# Patient Record
Sex: Female | Born: 1985 | Race: White | Hispanic: No | Marital: Married | Smoking: Former smoker
Health system: Southern US, Community
[De-identification: ages and names within clinical notes are randomized; demographics above are authoritative.]

## PROBLEM LIST (undated history)

## (undated) DIAGNOSIS — E282 Polycystic ovarian syndrome: Secondary | ICD-10-CM

## (undated) DIAGNOSIS — K219 Gastro-esophageal reflux disease without esophagitis: Secondary | ICD-10-CM

## (undated) DIAGNOSIS — N3020 Other chronic cystitis without hematuria: Secondary | ICD-10-CM

## (undated) DIAGNOSIS — F32A Depression, unspecified: Secondary | ICD-10-CM

## (undated) DIAGNOSIS — F329 Major depressive disorder, single episode, unspecified: Secondary | ICD-10-CM

## (undated) DIAGNOSIS — O24419 Gestational diabetes mellitus in pregnancy, unspecified control: Secondary | ICD-10-CM

## (undated) DIAGNOSIS — F419 Anxiety disorder, unspecified: Secondary | ICD-10-CM

## (undated) DIAGNOSIS — R030 Elevated blood-pressure reading, without diagnosis of hypertension: Secondary | ICD-10-CM

## (undated) HISTORY — PX: BREAST ENHANCEMENT SURGERY: SHX7

## (undated) HISTORY — DX: Elevated blood-pressure reading, without diagnosis of hypertension: R03.0

## (undated) HISTORY — DX: Other chronic cystitis without hematuria: N30.20

## (undated) HISTORY — PX: CHOLECYSTECTOMY: SHX55

## (undated) HISTORY — DX: Gastro-esophageal reflux disease without esophagitis: K21.9

## (undated) HISTORY — DX: Polycystic ovarian syndrome: E28.2

## (undated) HISTORY — DX: Gestational diabetes mellitus in pregnancy, unspecified control: O24.419

## (undated) HISTORY — DX: Anxiety disorder, unspecified: F41.9

---
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
## 2004-07-28 ENCOUNTER — Other Ambulatory Visit: Admission: RE | Admit: 2004-07-28 | Discharge: 2004-07-28 | Payer: Self-pay | Admitting: Obstetrics and Gynecology

## 2010-10-25 ENCOUNTER — Emergency Department (HOSPITAL_BASED_OUTPATIENT_CLINIC_OR_DEPARTMENT_OTHER)
Admission: EM | Admit: 2010-10-25 | Discharge: 2010-10-25 | Payer: Self-pay | Source: Home / Self Care | Admitting: Emergency Medicine

## 2010-10-26 ENCOUNTER — Emergency Department (HOSPITAL_BASED_OUTPATIENT_CLINIC_OR_DEPARTMENT_OTHER)
Admission: EM | Admit: 2010-10-26 | Discharge: 2010-10-26 | Payer: Self-pay | Source: Home / Self Care | Admitting: Emergency Medicine

## 2011-01-15 LAB — URINALYSIS, ROUTINE W REFLEX MICROSCOPIC
Bilirubin Urine: NEGATIVE
Glucose, UA: NEGATIVE mg/dL
Hgb urine dipstick: NEGATIVE
Ketones, ur: NEGATIVE mg/dL
Nitrite: NEGATIVE
Protein, ur: NEGATIVE mg/dL
Specific Gravity, Urine: 1.015 (ref 1.005–1.030)
Urobilinogen, UA: 0.2 mg/dL (ref 0.0–1.0)
pH: 8 (ref 5.0–8.0)

## 2011-01-15 LAB — PREGNANCY, URINE: Preg Test, Ur: NEGATIVE

## 2012-03-28 ENCOUNTER — Emergency Department (HOSPITAL_BASED_OUTPATIENT_CLINIC_OR_DEPARTMENT_OTHER): Payer: No Typology Code available for payment source

## 2012-03-28 ENCOUNTER — Emergency Department (HOSPITAL_BASED_OUTPATIENT_CLINIC_OR_DEPARTMENT_OTHER)
Admission: EM | Admit: 2012-03-28 | Discharge: 2012-03-29 | Disposition: A | Discharge status: Home / Self Care | Payer: No Typology Code available for payment source | Attending: Emergency Medicine | Admitting: Emergency Medicine

## 2012-03-28 ENCOUNTER — Encounter (HOSPITAL_BASED_OUTPATIENT_CLINIC_OR_DEPARTMENT_OTHER): Payer: Self-pay | Admitting: *Deleted

## 2012-03-28 DIAGNOSIS — W230XXA Caught, crushed, jammed, or pinched between moving objects, initial encounter: Secondary | ICD-10-CM | POA: Insufficient documentation

## 2012-03-28 DIAGNOSIS — S62309A Unspecified fracture of unspecified metacarpal bone, initial encounter for closed fracture: Secondary | ICD-10-CM | POA: Insufficient documentation

## 2012-03-28 DIAGNOSIS — F17200 Nicotine dependence, unspecified, uncomplicated: Secondary | ICD-10-CM | POA: Insufficient documentation

## 2012-03-28 HISTORY — DX: Depression, unspecified: F32.A

## 2012-03-28 HISTORY — DX: Major depressive disorder, single episode, unspecified: F32.9

## 2012-03-28 MED ORDER — OXYCODONE-ACETAMINOPHEN 5-325 MG PO TABS: 2.0000 | ORAL_TABLET | ORAL | Status: AC | PRN

## 2012-03-28 MED ORDER — OXYCODONE-ACETAMINOPHEN 5-325 MG PO TABS
2.0000 | ORAL_TABLET | Freq: Once | ORAL | Status: AC
  Administered 2012-03-29: 2 via ORAL
  Filled 2012-03-28: qty 2

## 2012-03-28 NOTE — Discharge Instructions (Signed)
Hand Fracture, Metacarpals  Fractures of metacarpals are breaks in the bones of the hand. They extend from the knuckles to the wrist. These bones can undergo many types of fractures. There are different ways of treating these fractures, all of which may be correct.  TREATMENT   Hand fractures can be treated with:    Non-reduction - The fracture is casted without changing the positions of the fracture (bone pieces) involved. This fracture is usually left in a cast for 4 to 6 weeks or as your caregiver thinks necessary.   Closed reduction - The bones are moved back into position without surgery and then casted.   ORIF (open reduction and internal fixation) - The fracture site is opened and the bone pieces are fixed into place with some type of hardware, such as screws, etc. They are then casted.  Your caregiver will discuss the type of fracture you have and the treatment that should be best for that problem. If surgery is chosen, let your caregivers know about the following.   LET YOUR CAREGIVERS KNOW ABOUT:   Allergies.   Medications you are taking, including herbs, eye drops, over the counter medications, and creams.   Use of steroids (by mouth or creams).   Previous problems with anesthetics or novocaine.   Possibility of pregnancy.   History of blood clots (thrombophlebitis).   History of bleeding or blood problems.   Previous surgeries.   Other health problems.  AFTER THE PROCEDURE  After surgery, you will be taken to the recovery area where a nurse will watch and check your progress. Once you are awake, stable, and taking fluids well, barring other problems, you'll be allowed to go home. Once home, an ice pack applied to your operative site may help with pain and keep the swelling down.  HOME CARE INSTRUCTIONS    Follow your caregiver's instructions as to activities, exercises, physical therapy, and driving a car.   Daily exercise is helpful for keeping range of motion and strength. Exercise as  instructed.   To lessen swelling, keep the injured hand elevated above the level of your heart as much as possible.   Apply ice to the injury for 15 to 20 minutes each hour while awake for the first 2 days. Put the ice in a plastic bag and place a thin towel between the bag of ice and your cast.   Move the fingers of your casted hand several times a day.   If a plaster or fiberglass cast was applied:   Do not try to scratch the skin under the cast using a sharp or pointed object.   Check the skin around the cast every day. You may put lotion on red or sore areas.   Keep your cast dry. Your cast can be protected during bathing with a plastic bag. Do not put your cast into the water.   If a plaster splint was applied:   Wear your splint for as long as directed by your caregiver or until seen again.   Do not get your splint wet. Protect it during bathing with a plastic bag.   You may loosen the elastic bandage around the splint if your fingers start to get numb, tingle, get cold or turn blue.   Do not put pressure on your cast or splint; this may cause it to break. Especially, do not lean plaster casts on hard surfaces for 24 hours after application.   Take medications as directed by your caregiver.     Only take over-the-counter or prescription medicines for pain, discomfort, or fever as directed by your caregiver.   Follow-up as provided by your caregiver. This is very important in order to avoid permanent injury or disability and chronic pain.  SEEK MEDICAL CARE IF:    Increased bleeding (more than a small spot) from beneath your cast or splint if there is beneath the cast as with an open reduction.   Redness, swelling, or increasing pain in the wound or from beneath your cast or splint.   Pus coming from wound or from beneath your cast or splint.   An unexplained oral temperature above 102 F (38.9 C) develops, or as your caregiver suggests.   A foul smell coming from the wound or dressing or  from beneath your cast or splint.   You have a problem moving any of your fingers.  SEEK IMMEDIATE MEDICAL CARE IF:    You develop a rash   You have difficulty breathing   You have any allergy problems  If you do not have a window in your cast for observing the wound, a discharge or minor bleeding may show up as a stain on the outside of your cast. Report these findings to your caregiver.  MAKE SURE YOU:    Understand these instructions.   Will watch your condition.   Will get help right away if you are not doing well or get worse.  Document Released: 10/22/2005 Document Revised: 10/11/2011 Document Reviewed: 06/10/2008  [employer] Patient Information 2012 [employer].

## 2012-03-28 NOTE — ED Provider Notes (Signed)
History     CSN: [id number]  Arrival date & time 03/28/12  2158   First MD Initiated Contact with Patient 03/28/12 2300      No chief complaint on file.   (Consider location/radiation/quality/duration/timing/severity/associated sxs/prior treatment) HPI Comments: Pt slammed right hand in car door just PTA.  Is right hand dominant.  Denies other injuries.  Patient is a 26 y.o. female presenting with hand pain. The history is provided by the patient.  Hand Pain This is a new problem. The current episode started 1 to 2 hours ago. The problem occurs constantly. The problem has been gradually worsening. Pertinent negatives include no chest pain, no abdominal pain, no headaches and no shortness of breath. The symptoms are aggravated by bending. The symptoms are relieved by nothing. She has tried nothing for the symptoms.    Past Medical History  Diagnosis Date  . Depression     History reviewed. No pertinent past surgical history.  No family history on file.  History  Substance Use Topics  . Smoking status: Current Everyday Smoker -- 1.0 packs/day  . Smokeless tobacco: Not on file  . Alcohol Use: No    OB History    Grav Para Term Preterm Abortions TAB SAB Ect Mult Living                  Review of Systems  Constitutional: Negative for fever, chills, diaphoresis and fatigue.  HENT: Negative for congestion, rhinorrhea and sneezing.   Eyes: Negative.   Respiratory: Negative for cough, chest tightness and shortness of breath.   Cardiovascular: Negative for chest pain and leg swelling.  Gastrointestinal: Negative for nausea, vomiting, abdominal pain, diarrhea and blood in stool.  Genitourinary: Negative for frequency, hematuria, flank pain and difficulty urinating.  Musculoskeletal: Positive for joint swelling. Negative for back pain and arthralgias.  Skin: Positive for wound. Negative for rash.  Neurological: Negative for dizziness, speech difficulty, weakness, numbness and  headaches.    Allergies  Review of patient's allergies indicates no known allergies.  Home Medications   Current Outpatient Rx  Name Route Sig Dispense Refill  . CINNAMON PO Oral Take 2 tablets by mouth daily.    Marland Kitchen ESCITALOPRAM OXALATE 20 MG PO TABS Oral Take 20 mg by mouth daily.    Marland Kitchen LORATADINE 10 MG PO TABS Oral Take 10 mg by mouth daily. Patient is using this medication for allergies.    Marland Kitchen EX-LAX PO Oral Take 1 tablet by mouth daily as needed. Patient used this medication for occasional constipation.    . OXYCODONE-ACETAMINOPHEN 5-325 MG PO TABS Oral Take 2 tablets by mouth every 4 (four) hours as needed for pain. 15 tablet 0    BP 116/71  Pulse 106  Temp(Src) 98 F (36.7 C) (Oral)  Resp 20  SpO2 99%  LMP 03/28/2012  Physical Exam  Constitutional: She is oriented to person, place, and time. She appears well-developed and well-nourished.  HENT:  Head: Normocephalic and atraumatic.  Eyes: Pupils are equal, round, and reactive to light.  Neck: Normal range of motion. Neck supple.  Cardiovascular: Normal rate, regular rhythm and normal heart sounds.   Pulmonary/Chest: Effort normal and breath sounds normal. No respiratory distress. She has no wheezes. She has no rales. She exhibits no tenderness.  Abdominal: Soft. Bowel sounds are normal. There is no tenderness. There is no rebound and no guarding.  Musculoskeletal: Normal range of motion. She exhibits no edema.       Pt has moderate edema  and pain to right hand, primarily along 2nd metatarsal/finger.  Small abrasion to 2nd finder over middle phalanx.  Is able to flex/extend fingers.  Normal sensation/CRT distally.  Lymphadenopathy:    She has no cervical adenopathy.  Neurological: She is alert and oriented to person, place, and time.  Skin: Skin is warm and dry. No rash noted.  Psychiatric: She has a normal mood and affect.    ED Course  Procedures (including critical care time)  Labs Reviewed - No data to display Dg  Hand Complete Right  03/28/2012  *RADIOLOGY REPORT*  Clinical Data: Swelling and pain in the right hand after shutting in car door.  RIGHT HAND - COMPLETE 3+ VIEW  Comparison: None  Findings: Spiral oblique fracture of the mid and distal shaft of the right second metacarpal bone with mild volar angulation. Dorsal soft tissue swelling.  Bones appear otherwise intact.  No focal bone lesion or bone destruction.  No abnormal periosteal reaction.  IMPRESSION: A spiral oblique fracture of the right second metacarpal bone with mild volar angulation.  Soft tissue swelling.  Original Report Authenticated By: Marlon Pel, M.D.     1. Metacarpal bone fracture       MDM  Pt with MC fracture of 2nd digit.  Placed in volar splint.  Pain meds given.  Will f/u with orthopedist for definitive management        Rolan Bucco, MD 03/28/12 [phone]

## 2012-03-29 NOTE — ED Notes (Signed)
I applied volar splint using fiberglass materials, but not adequate in width, so I switched to traditional plaster and completed task, patient's capillary refill was under two seconds.I explained care and safety of splint. I applied and adjusted arm sling to alleviate weight of splint and to allow ice bag to remain in place. Patient comfort level was improved.

## 2014-11-30 ENCOUNTER — Other Ambulatory Visit: Payer: Self-pay | Admitting: Gastroenterology

## 2014-11-30 DIAGNOSIS — R112 Nausea with vomiting, unspecified: Secondary | ICD-10-CM

## 2014-11-30 DIAGNOSIS — R1084 Generalized abdominal pain: Secondary | ICD-10-CM

## 2014-12-15 ENCOUNTER — Encounter (HOSPITAL_COMMUNITY): Payer: PRIVATE HEALTH INSURANCE

## 2014-12-15 ENCOUNTER — Ambulatory Visit (HOSPITAL_COMMUNITY): Admission: RE | Admit: 2014-12-15 | Payer: PRIVATE HEALTH INSURANCE | Source: Ambulatory Visit

## 2016-03-11 ENCOUNTER — Emergency Department (HOSPITAL_COMMUNITY): Payer: BLUE CROSS/BLUE SHIELD

## 2016-03-11 ENCOUNTER — Emergency Department (HOSPITAL_COMMUNITY)
Admission: EM | Admit: 2016-03-11 | Discharge: 2016-03-11 | Disposition: A | Discharge status: Home / Self Care | Payer: BLUE CROSS/BLUE SHIELD | Attending: Emergency Medicine | Admitting: Emergency Medicine

## 2016-03-11 ENCOUNTER — Encounter (HOSPITAL_COMMUNITY): Payer: Self-pay | Admitting: Emergency Medicine

## 2016-03-11 DIAGNOSIS — F329 Major depressive disorder, single episode, unspecified: Secondary | ICD-10-CM | POA: Insufficient documentation

## 2016-03-11 DIAGNOSIS — N390 Urinary tract infection, site not specified: Secondary | ICD-10-CM | POA: Diagnosis not present

## 2016-03-11 DIAGNOSIS — R109 Unspecified abdominal pain: Secondary | ICD-10-CM | POA: Diagnosis present

## 2016-03-11 DIAGNOSIS — R112 Nausea with vomiting, unspecified: Secondary | ICD-10-CM | POA: Diagnosis not present

## 2016-03-11 DIAGNOSIS — F17200 Nicotine dependence, unspecified, uncomplicated: Secondary | ICD-10-CM | POA: Diagnosis not present

## 2016-03-11 DIAGNOSIS — Z3202 Encounter for pregnancy test, result negative: Secondary | ICD-10-CM | POA: Insufficient documentation

## 2016-03-11 DIAGNOSIS — Z79899 Other long term (current) drug therapy: Secondary | ICD-10-CM | POA: Diagnosis not present

## 2016-03-11 LAB — URINALYSIS, ROUTINE W REFLEX MICROSCOPIC
Bilirubin Urine: NEGATIVE
Glucose, UA: NEGATIVE mg/dL
Ketones, ur: NEGATIVE mg/dL
Nitrite: NEGATIVE
Protein, ur: NEGATIVE mg/dL
Specific Gravity, Urine: 1.025 (ref 1.005–1.030)
pH: 6 (ref 5.0–8.0)

## 2016-03-11 LAB — URINE MICROSCOPIC-ADD ON

## 2016-03-11 LAB — CBC WITH DIFFERENTIAL/PLATELET
Basophils Absolute: 0 K/uL (ref 0.0–0.1)
Basophils Relative: 0 %
Eosinophils Absolute: 0.1 K/uL (ref 0.0–0.7)
Eosinophils Relative: 1 %
HCT: 41.9 % (ref 36.0–46.0)
Hemoglobin: 14.1 g/dL (ref 12.0–15.0)
Lymphocytes Relative: 19 %
Lymphs Abs: 1.4 K/uL (ref 0.7–4.0)
MCH: 29.9 pg (ref 26.0–34.0)
MCHC: 33.7 g/dL (ref 30.0–36.0)
MCV: 89 fL (ref 78.0–100.0)
Monocytes Absolute: 0.7 K/uL (ref 0.1–1.0)
Monocytes Relative: 9 %
Neutro Abs: 5.5 K/uL (ref 1.7–7.7)
Neutrophils Relative %: 71 %
Platelets: 222 K/uL (ref 150–400)
RBC: 4.71 MIL/uL (ref 3.87–5.11)
RDW: 13.3 % (ref 11.5–15.5)
WBC: 7.8 K/uL (ref 4.0–10.5)

## 2016-03-11 LAB — COMPREHENSIVE METABOLIC PANEL WITH GFR
ALT: 40 U/L (ref 14–54)
AST: 31 U/L (ref 15–41)
Albumin: 4.3 g/dL (ref 3.5–5.0)
Alkaline Phosphatase: 63 U/L (ref 38–126)
Anion gap: 9 (ref 5–15)
BUN: 11 mg/dL (ref 6–20)
CO2: 21 mmol/L — ABNORMAL LOW (ref 22–32)
Calcium: 9.1 mg/dL (ref 8.9–10.3)
Chloride: 110 mmol/L (ref 101–111)
Creatinine, Ser: 0.81 mg/dL (ref 0.44–1.00)
GFR calc Af Amer: >60 mL/min
GFR calc non Af Amer: >60 mL/min
Glucose, Bld: 95 mg/dL (ref 65–99)
Potassium: 3.6 mmol/L (ref 3.5–5.1)
Sodium: 140 mmol/L (ref 135–145)
Total Bilirubin: 0.4 mg/dL (ref 0.3–1.2)
Total Protein: 7.2 g/dL (ref 6.5–8.1)

## 2016-03-11 LAB — I-STAT BETA HCG BLOOD, ED (MC, WL, AP ONLY): I-stat hCG, quantitative: <5 m[IU]/mL

## 2016-03-11 LAB — LIPASE, BLOOD: Lipase: 29 U/L (ref 11–51)

## 2016-03-11 MED ORDER — ONDANSETRON 4 MG PO TBDP: 4.0000 mg | ORAL_TABLET | Freq: Three times a day (TID) | ORAL | Status: DC | PRN

## 2016-03-11 MED ORDER — FLUCONAZOLE 150 MG PO TABS: 150.0000 mg | ORAL_TABLET | Freq: Every day | ORAL | Status: DC

## 2016-03-11 MED ORDER — KETOROLAC TROMETHAMINE 30 MG/ML IJ SOLN
30.0000 mg | Freq: Once | INTRAMUSCULAR | Status: AC
  Administered 2016-03-11: 30 mg via INTRAVENOUS
  Filled 2016-03-11: qty 1

## 2016-03-11 MED ORDER — SULFAMETHOXAZOLE-TRIMETHOPRIM 800-160 MG PO TABS: 1.0000 | ORAL_TABLET | Freq: Two times a day (BID) | ORAL | Status: AC

## 2016-03-11 MED ORDER — ONDANSETRON HCL 4 MG/2ML IJ SOLN
4.0000 mg | Freq: Once | INTRAMUSCULAR | Status: AC
  Administered 2016-03-11: 4 mg via INTRAVENOUS
  Filled 2016-03-11: qty 2

## 2016-03-11 MED ORDER — SULFAMETHOXAZOLE-TRIMETHOPRIM 800-160 MG PO TABS
1.0000 | ORAL_TABLET | Freq: Once | ORAL | Status: AC
  Administered 2016-03-11: 1 via ORAL
  Filled 2016-03-11: qty 1

## 2016-03-11 NOTE — Discharge Instructions (Signed)
Urinary Tract Infection Urinary tract infections (UTIs) can develop anywhere along your urinary tract. Your urinary tract is your body's drainage system for removing wastes and extra water. Your urinary tract includes two kidneys, two ureters, a bladder, and a urethra. Your kidneys are a pair of bean-shaped organs. Each kidney is about the size of your fist. They are located below your ribs, one on each side of your spine. CAUSES Infections are caused by microbes, which are microscopic organisms, including fungi, viruses, and bacteria. These organisms are so small that they can only be seen through a microscope. Bacteria are the microbes that most commonly cause UTIs. SYMPTOMS  Symptoms of UTIs may vary by age and gender of the patient and by the location of the infection. Symptoms in young women typically include a frequent and intense urge to urinate and a painful, burning feeling in the bladder or urethra during urination. Older women and men are more likely to be tired, shaky, and weak and have muscle aches and abdominal pain. A fever may mean the infection is in your kidneys. Other symptoms of a kidney infection include pain in your back or sides below the ribs, nausea, and vomiting. DIAGNOSIS To diagnose a UTI, your caregiver will ask you about your symptoms. Your caregiver will also ask you to provide a urine sample. The urine sample will be tested for bacteria and white blood cells. White blood cells are made by your body to help fight infection. TREATMENT  Typically, UTIs can be treated with medication. Because most UTIs are caused by a bacterial infection, they usually can be treated with the use of antibiotics. The choice of antibiotic and length of treatment depend on your symptoms and the type of bacteria causing your infection. HOME CARE INSTRUCTIONS  If you were prescribed antibiotics, take them exactly as your caregiver instructs you. Finish the medication even if you feel better after  you have only taken some of the medication.  Drink enough water and fluids to keep your urine clear or pale yellow.  Avoid caffeine, tea, and carbonated beverages. They tend to irritate your bladder.  Empty your bladder often. Avoid holding urine for long periods of time.  Empty your bladder before and after sexual intercourse.  After a bowel movement, women should cleanse from front to back. Use each tissue only once. SEEK MEDICAL CARE IF:   You have back pain.  You develop a fever.  Your symptoms do not begin to resolve within 3 days. SEEK IMMEDIATE MEDICAL CARE IF:   You have severe back pain or lower abdominal pain.  You develop chills.  You have nausea or vomiting.  You have continued burning or discomfort with urination. MAKE SURE YOU:   Understand these instructions.  Will watch your condition.  Will get help right away if you are not doing well or get worse.   This information is not intended to replace advice given to you by your health care provider. Make sure you discuss any questions you have with your health care provider.   Follow up with your primary care provider if your symptoms do not improve. Take antibiotics as prescribed. Take zofran as needed for nausea. Return to the ED if you ask parents worsening of her symptoms, increased pain, vomiting, fevers, chills.

## 2016-03-11 NOTE — ED Provider Notes (Signed)
CSN: [id number]     Arrival date & time 03/11/16  1719 History   First MD Initiated Contact with Patient 03/11/16 1731     Chief Complaint  Patient presents with  . Flank Pain  . Nausea     (Consider location/radiation/quality/duration/timing/severity/associated sxs/prior Treatment) HPI   Jody Crane is a 30 y.o F with No significant past medical history who presents the ED today complaining of flank pain. Patient states that she was home from work when she felt acute onset sharp, right-sided flank pain. Pain was 10 out of 10 and lasted for approximately 45 minutes. Patient had associated vomiting. Patient also reports associated dysuria for the last 2-3 days. She denies increased urgency, frequency odor. No hematuria noted. Patient states that she has a strong family history of kidney stones and feels that this might be one as well. She denies fevers, chills, abdominal pain, diarrhea, melena, hematochezia.  Past Medical History  Diagnosis Date  . Depression    Past Surgical History  Procedure Laterality Date  . Cholecystectomy     History reviewed. No pertinent family history. Social History  Substance Use Topics  . Smoking status: Current Every Day Smoker -- 1.00 packs/day  . Smokeless tobacco: None  . Alcohol Use: No   OB History    No data available     Review of Systems  All other systems reviewed and are negative.     Allergies  Review of patient's allergies indicates no known allergies.  Home Medications   Prior to Admission medications   Medication Sig Start Date End Date Taking? Authorizing Provider  albuterol (PROAIR HFA) 108 (90 Base) MCG/ACT inhaler Inhale 2 puffs into the lungs as needed. wheezing 06/14/15  Yes Historical Provider, MD  buPROPion (WELLBUTRIN XL) 300 MG 24 hr tablet Take 300 mg by mouth daily.   Yes Historical Provider, MD  clonazePAM (KLONOPIN) 1 MG tablet Take 1 mg by mouth as needed. anxiety 10/24/15  Yes Historical Provider, MD   escitalopram (LEXAPRO) 20 MG tablet Take 10 mg by mouth daily.    Yes Historical Provider, MD  etonogestrel-ethinyl estradiol (NUVARING) 0.12-0.015 MG/24HR vaginal ring 1 each. 10/24/15  Yes Historical Provider, MD  loratadine (CLARITIN) 10 MG tablet Take 10 mg by mouth daily. Patient is using this medication for allergies.   Yes Historical Provider, MD  omeprazole (PRILOSEC) 40 MG capsule Take 40 mg by mouth 2 (two) times daily.   Yes Historical Provider, MD  topiramate (TOPAMAX) 25 MG tablet Take 50 mg by mouth 2 (two) times daily. 05/18/14  Yes Historical Provider, MD   BP 142/91 mmHg  Pulse 88  Temp(Src) 98.1 F (36.7 C) (Oral)  Resp 20  SpO2 100% Physical Exam  Constitutional: She is oriented to person, place, and time. She appears well-developed and well-nourished. No distress.  HENT:  Head: Normocephalic and atraumatic.  Mouth/Throat: No oropharyngeal exudate.  Eyes: Conjunctivae and EOM are normal. Pupils are equal, round, and reactive to light. Right eye exhibits no discharge. Left eye exhibits no discharge. No scleral icterus.  Cardiovascular: Normal rate, regular rhythm, normal heart sounds and intact distal pulses.  Exam reveals no gallop and no friction rub.   No murmur heard. Pulmonary/Chest: Effort normal and breath sounds normal. No respiratory distress. She has no wheezes. She has no rales. She exhibits no tenderness.  Abdominal: Soft. She exhibits no distension and no mass. There is no tenderness. There is no rebound and no guarding.  No CVA tenderness.   Musculoskeletal:  Normal range of motion. She exhibits no edema.  Neurological: She is alert and oriented to person, place, and time.  Skin: Skin is warm and dry. No rash noted. She is not diaphoretic. No erythema. No pallor.  Psychiatric: She has a normal mood and affect. Her behavior is normal.  Nursing note and vitals reviewed.   ED Course  Procedures (including critical care time) Labs Review Labs Reviewed   URINALYSIS, ROUTINE W REFLEX MICROSCOPIC (NOT AT [facility]) - Abnormal; Notable for the following:    Color, Urine AMBER (*)    APPearance CLOUDY (*)    Hgb urine dipstick LARGE (*)    Leukocytes, UA SMALL (*)    All other components within normal limits  COMPREHENSIVE METABOLIC PANEL - Abnormal; Notable for the following:    CO2 21 (*)    All other components within normal limits  URINE MICROSCOPIC-ADD ON - Abnormal; Notable for the following:    Squamous Epithelial / LPF 0-5 (*)    Bacteria, UA MANY (*)    All other components within normal limits  URINE CULTURE  LIPASE, BLOOD  CBC WITH DIFFERENTIAL/PLATELET  I-STAT BETA HCG BLOOD, ED ([facility], [facility], [facility] ONLY)    Imaging Review Ct Renal Stone Study  03/11/2016  CLINICAL DATA:  30 year old female with acute right flank, abdominal and pelvic pain today. EXAM: CT ABDOMEN AND PELVIS WITHOUT CONTRAST TECHNIQUE: Multidetector CT imaging of the abdomen and pelvis was performed following the standard protocol without IV contrast. COMPARISON:  None. FINDINGS: Please note that parenchymal abnormalities may be missed without intravenous contrast. Lower chest: Bilateral breast prosthesis noted. No other significant abnormalities. Hepatobiliary: The liver is unremarkable. The patient is status post cholecystectomy. There is no evidence of biliary dilatation. Pancreas: Unremarkable Spleen: Unremarkable Adrenals/Urinary Tract: The kidneys, adrenal glands and bladder are unremarkable. Stomach/Bowel: Unremarkable. There is no evidence of bowel obstruction. The appendix is normal. Vascular/Lymphatic: Unremarkable. No enlarged lymph nodes or abdominal aortic aneurysm. Reproductive: Unremarkable Other: No free fluid, focal collection or pneumoperitoneum. Musculoskeletal: No acute or suspicious abnormalities. IMPRESSION: No acute or significant abnormality. Electronically Signed   By: Harmon PierJeffrey  Hu M.D.   On: 03/11/2016 19:20   I have personally reviewed and evaluated these  images and lab results as part of my medical decision-making.   EKG Interpretation None      MDM   Final diagnoses:  UTI (lower urinary tract infection)    Otherwise healthy 30 year old female presents to the ED today complaining of acute onset right-sided flank pain and vomiting. She also reports associated dysuria onset 2 days ago. Patient states episode of flank pain lasted for approximate 45 minutes and has since resolved. On presentation to ED patient appears well, in no apparent distress. She is nontoxic, nonseptic appearing. Vital signs are stable. She is nontender to palpation at this time. Patient has a long family history of kidney stones. Possible she may have passed one. Pt given toradol and zofran in the ED with symptomatic improvement. CT renal study is unremarkable. UA reveals infection. Will send for culture and treat with abx. Pt also requesting Diflucan as she will likely a yeast infection after taking the antibiotics. All other lab work within normal limits. Will treat for UTI and sent home with PCP follow-up. Patient is hemodynamically stable and ready for discharge. Discussed treatment plan with patient who is agreeable.     Lester KinsmanSamantha Tripp WaltonDowless, PA-C 03/11/16 2107  Lorre NickAnthony Allen, MD 03/18/16 [phone]

## 2016-03-11 NOTE — ED Notes (Signed)
Urinary burning/frequency over the last couple days. On the way home today experienced extreme back pain, 10/10 in right lower flank, family hx of kidney stones, she has not had one. States the pain has eased slightly, but has noticed some blood in the urine.

## 2016-03-13 LAB — URINE CULTURE

## 2018-05-06 LAB — OB RESULTS CONSOLE GC/CHLAMYDIA
Chlamydia: NEGATIVE
Gonorrhea: NEGATIVE

## 2018-05-06 LAB — OB RESULTS CONSOLE ANTIBODY SCREEN: ANTIBODY SCREEN: NEGATIVE

## 2018-05-06 LAB — OB RESULTS CONSOLE ABO/RH: RH TYPE: POSITIVE

## 2018-05-06 LAB — OB RESULTS CONSOLE RUBELLA ANTIBODY, IGM: Rubella: IMMUNE

## 2018-05-06 LAB — OB RESULTS CONSOLE HIV ANTIBODY (ROUTINE TESTING): HIV: NONREACTIVE

## 2018-05-06 LAB — OB RESULTS CONSOLE HEPATITIS B SURFACE ANTIGEN: HEP B S AG: NEGATIVE

## 2018-05-06 LAB — OB RESULTS CONSOLE RPR: RPR: NONREACTIVE

## 2018-07-22 LAB — OB RESULTS CONSOLE GBS: STREP GROUP B AG: NEGATIVE

## 2018-07-23 ENCOUNTER — Telehealth (HOSPITAL_COMMUNITY): Payer: Self-pay | Admitting: *Deleted

## 2018-07-23 ENCOUNTER — Encounter (HOSPITAL_COMMUNITY): Payer: Self-pay | Admitting: *Deleted

## 2018-07-23 NOTE — Telephone Encounter (Signed)
Preadmission screen  

## 2018-07-23 NOTE — Telephone Encounter (Signed)
Preadmission screen C/o decreased fetal movement this morning.  Has not eaten breakfast.  Instructed to eat peanut crackers and drink milk along with getting a large glass of water.  Lay on side and count fetal movements for 1 hour.  Notify Md office of results. Dr Ernestina PennaFogleman office notified

## 2018-07-25 ENCOUNTER — Other Ambulatory Visit: Payer: Self-pay | Admitting: Obstetrics

## 2018-07-25 NOTE — H&P (Signed)
Jody Crane is a 32 y.o. G1P0 at 3280w0d presenting for IOL for gest htn. Pt notes rare contraction . Good fetal movement, No vaginal bleeding, not leaking fluid. Intermittent HA, repsonds to tylenol. Mild RUQ pain  Patient admitted overnight for cervical ripening. Has had Cytotec 4 placed. Patient notes mild cramping. No pain. No headache.  P[facility]are at [facility] since 22 wks - [facility] at [facility] wks - Dated by 7 wk u/s - obesity - Anemia, on iron - Anxiety, on Lexapro, mood stable through pregnancy - PCOS but spontaneous preg on metformin - GDM used metformin early in peg for PCOS, A1C 5.8, GDM at 28 wks, increased metformin to 1000 bid and stable BS through pregnancy - Gest htn. First diagnosed at 36 wks, bps improved on home bed rest but still mild range at 140s/90s. Never started on antihypertensives. PIH labs and fetal testing 2/wk at diagnosis. BMZ complete - fetal growth- 36 wks. 8'7, 99%, vtx, ant placenta, polyhydramnios, AFI 27, AC 37.2, HC 33.3   Prenatal Transfer Tool  Maternal Diabetes: Yes:  Diabetes Type:  Insulin/Medication controlled Genetic Screening: Declined Maternal Ultrasounds/Referrals: Normal Fetal Ultrasounds or other Referrals:  None Maternal Substance Abuse:  No Significant Maternal Medications:  None Significant Maternal Lab Results: None     OB History    Gravida  1   Para      Term      Preterm      AB      Living        SAB      TAB      Ectopic      Multiple      Live Births             Past Medical History:  Diagnosis Date  . Anxiety   . Chronic cystitis   . Depression   . Elevated blood pressure reading without diagnosis of hypertension   . GERD (gastroesophageal reflux disease)   . Gestational diabetes   . PCOS (polycystic ovarian syndrome)    Past Surgical History:  Procedure Laterality Date  . BREAST ENHANCEMENT SURGERY    . CHOLECYSTECTOMY     Family History: family history  includes Aneurysm in her maternal grandfather and maternal grandmother; Cataracts in her maternal grandfather; Celiac disease in her mother; Diabetes in her father and paternal grandfather; Glaucoma in her maternal grandfather and maternal grandmother; Hyperlipidemia in her father; Hypertension in her maternal grandfather and maternal grandmother; Kidney disease in her maternal grandmother; Thyroid disease in her maternal grandmother. Social History:  reports that she quit smoking about 3 years ago. She smoked 1.00 pack per day. She has never used smokeless tobacco. She reports that she does not drink alcohol or use drugs.  Review of Systems - Negative except discomfort of pregnancy    Physical Exam:   Vitals:   07/27/18 0938 07/27/18 1058 07/27/18 1202 07/27/18 1348  BP: (!) 154/88 (!) 149/88 (!) 141/81 (!) 142/64  Pulse: (!) 106 (!) 106 (!) 104 (!) 108  Resp: 18 18 18 18   Temp:  97.7 F (36.5 C)    TempSrc:  Oral    Weight:      Height:       Gen.: Well-appearing, no distress Abdomen: Gravid, nontender, no right upper quadrant pain GU: Cervix closed, long, posterior, firm, vertex ballotable Lower extremities: Nontender, no edema  Cervical Foley placement: Consent obtained from the patient verbally. Multiple speculums tried given no large  Graves available on the birthing suites unit. Eventually using an extra largeGraves speculum the cervix was visualized. A tenaculum was needed to move the cervix into field-of-view. A single-tooth tenaculum was placed on the anterior cervical lip after Betadine prep. A long ring forcep was used to thread the Foley catheter through the cervix. Foley bulb was filled to 60 cc of normal saline. All incisions were removed. Patient tolerated the procedure well.   CBC    Component Value Date/Time   WBC 10.5 07/27/2018 0110   RBC 3.50 (L) 07/27/2018 0110   HGB 10.4 (L) 07/27/2018 0110   HCT 31.7 (L) 07/27/2018 0110   PLT 199 07/27/2018 0110   MCV 90.6  07/27/2018 0110   MCH 29.7 07/27/2018 0110   MCHC 32.8 07/27/2018 0110   RDW 15.2 07/27/2018 0110   LYMPHSABS 1.4 03/11/2016 1810   MONOABS 0.7 03/11/2016 1810   EOSABS 0.1 03/11/2016 1810   BASOSABS 0.0 03/11/2016 1810   CMP     Component Value Date/Time   NA 135 07/27/2018 0110   K 4.1 07/27/2018 0110   CL 101 07/27/2018 0110   CO2 21 (L) 07/27/2018 0110   GLUCOSE 135 (H) 07/27/2018 0110   BUN 6 07/27/2018 0110   CREATININE 0.38 (L) 07/27/2018 0110   CALCIUM 9.0 07/27/2018 0110   PROT 5.8 (L) 07/27/2018 0110   ALBUMIN 3.0 (L) 07/27/2018 0110   AST 17 07/27/2018 0110   ALT 11 07/27/2018 0110   ALKPHOS 130 (H) 07/27/2018 0110   BILITOT 0.3 07/27/2018 0110   GFRNONAA >60 07/27/2018 0110   GFRAA >60 07/27/2018 0110      Prenatal labs: ABO, Rh: B/Positive/-- (07/02 0000) Antibody: Negative (07/02 0000) Rubella: Immune (07/02 0000) RPR: Nonreactive (07/02 0000)  HBsAg: Negative (07/02 0000)  HIV: Non-reactive (07/02 0000)  GBS:   neg 1 hr Glucola 175  Genetic screening no documentation, late transfer or care Anatomy US normal   Assessment/Plan: 32 y.o. G1P0 at [redacted]w[redacted]d - gest htn, no evidence PEC. Plan IOL now to decrease risks of severe range bp and PEC. Check labs on admit and prn - GDM. Watch BS in active labor - GBS neg - Anxiety, cont Lexapro - LGA, risk of shoulder dystocia given AC>HC and GDM, cautious trial of labor. Prep for shoulders at delivery  - Induction of labor. Patient status post Cytotec 4. Foley now placed. Will start Pitocin at 5:30 PM.  Lendon Colonel 07/27/2018 3:13 PM

## 2018-07-27 ENCOUNTER — Encounter (HOSPITAL_COMMUNITY): Payer: Self-pay

## 2018-07-27 ENCOUNTER — Inpatient Hospital Stay (HOSPITAL_COMMUNITY)
Admission: RE | Admit: 2018-07-27 | Discharge: 2018-07-30 | DRG: 788 | Disposition: A | Discharge status: Home / Self Care | Payer: Commercial Managed Care - PPO | Attending: Obstetrics & Gynecology | Admitting: Obstetrics & Gynecology

## 2018-07-27 DIAGNOSIS — O9962 Diseases of the digestive system complicating childbirth: Secondary | ICD-10-CM | POA: Diagnosis present

## 2018-07-27 DIAGNOSIS — O3663X0 Maternal care for excessive fetal growth, third trimester, not applicable or unspecified: Secondary | ICD-10-CM | POA: Diagnosis present

## 2018-07-27 DIAGNOSIS — Z87891 Personal history of nicotine dependence: Secondary | ICD-10-CM

## 2018-07-27 DIAGNOSIS — O99344 Other mental disorders complicating childbirth: Secondary | ICD-10-CM | POA: Diagnosis present

## 2018-07-27 DIAGNOSIS — O139 Gestational [pregnancy-induced] hypertension without significant proteinuria, unspecified trimester: Secondary | ICD-10-CM

## 2018-07-27 DIAGNOSIS — O24419 Gestational diabetes mellitus in pregnancy, unspecified control: Secondary | ICD-10-CM

## 2018-07-27 DIAGNOSIS — O403XX0 Polyhydramnios, third trimester, not applicable or unspecified: Secondary | ICD-10-CM | POA: Diagnosis present

## 2018-07-27 DIAGNOSIS — D649 Anemia, unspecified: Secondary | ICD-10-CM | POA: Diagnosis present

## 2018-07-27 DIAGNOSIS — F329 Major depressive disorder, single episode, unspecified: Secondary | ICD-10-CM | POA: Diagnosis present

## 2018-07-27 DIAGNOSIS — K219 Gastro-esophageal reflux disease without esophagitis: Secondary | ICD-10-CM | POA: Diagnosis present

## 2018-07-27 DIAGNOSIS — Z98891 History of uterine scar from previous surgery: Secondary | ICD-10-CM

## 2018-07-27 DIAGNOSIS — E282 Polycystic ovarian syndrome: Secondary | ICD-10-CM | POA: Diagnosis present

## 2018-07-27 DIAGNOSIS — O9902 Anemia complicating childbirth: Secondary | ICD-10-CM | POA: Diagnosis present

## 2018-07-27 DIAGNOSIS — Z3A37 37 weeks gestation of pregnancy: Secondary | ICD-10-CM

## 2018-07-27 DIAGNOSIS — Z3490 Encounter for supervision of normal pregnancy, unspecified, unspecified trimester: Secondary | ICD-10-CM | POA: Diagnosis present

## 2018-07-27 DIAGNOSIS — O134 Gestational [pregnancy-induced] hypertension without significant proteinuria, complicating childbirth: Principal | ICD-10-CM | POA: Diagnosis present

## 2018-07-27 DIAGNOSIS — O24425 Gestational diabetes mellitus in childbirth, controlled by oral hypoglycemic drugs: Secondary | ICD-10-CM | POA: Diagnosis present

## 2018-07-27 DIAGNOSIS — F419 Anxiety disorder, unspecified: Secondary | ICD-10-CM | POA: Diagnosis present

## 2018-07-27 DIAGNOSIS — O99214 Obesity complicating childbirth: Secondary | ICD-10-CM | POA: Diagnosis present

## 2018-07-27 LAB — GLUCOSE, CAPILLARY
GLUCOSE-CAPILLARY: 133 mg/dL — AB (ref 70–99)
Glucose-Capillary: 108 mg/dL — ABNORMAL HIGH (ref 70–99)
Glucose-Capillary: 155 mg/dL — ABNORMAL HIGH (ref 70–99)
Glucose-Capillary: 145 mg/dL — ABNORMAL HIGH (ref 70–99)
Glucose-Capillary: 87 mg/dL (ref 70–99)

## 2018-07-27 LAB — COMPREHENSIVE METABOLIC PANEL
ALK PHOS: 130 U/L — AB (ref 38–126)
ALT: 11 U/L (ref 0–44)
AST: 17 U/L (ref 15–41)
Albumin: 3 g/dL — ABNORMAL LOW (ref 3.5–5.0)
Anion gap: 13 (ref 5–15)
BILIRUBIN TOTAL: 0.3 mg/dL (ref 0.3–1.2)
BUN: 6 mg/dL (ref 6–20)
CALCIUM: 9 mg/dL (ref 8.9–10.3)
CO2: 21 mmol/L — ABNORMAL LOW (ref 22–32)
Chloride: 101 mmol/L (ref 98–111)
Creatinine, Ser: 0.38 mg/dL — ABNORMAL LOW (ref 0.44–1.00)
Glucose, Bld: 135 mg/dL — ABNORMAL HIGH (ref 70–99)
Potassium: 4.1 mmol/L (ref 3.5–5.1)
Sodium: 135 mmol/L (ref 135–145)
TOTAL PROTEIN: 5.8 g/dL — AB (ref 6.5–8.1)

## 2018-07-27 LAB — CBC
HCT: 31.7 % — ABNORMAL LOW (ref 36.0–46.0)
Hemoglobin: 10.4 g/dL — ABNORMAL LOW (ref 12.0–15.0)
MCH: 29.7 pg (ref 26.0–34.0)
MCHC: 32.8 g/dL (ref 30.0–36.0)
MCV: 90.6 fL (ref 78.0–100.0)
PLATELETS: 199 10*3/uL (ref 150–400)
RBC: 3.5 MIL/uL — ABNORMAL LOW (ref 3.87–5.11)
RDW: 15.2 % (ref 11.5–15.5)
WBC: 10.5 10*3/uL (ref 4.0–10.5)

## 2018-07-27 LAB — URIC ACID: URIC ACID, SERUM: 4 mg/dL (ref 2.5–7.1)

## 2018-07-27 LAB — TYPE AND SCREEN
ABO/RH(D): B POS
ANTIBODY SCREEN: NEGATIVE

## 2018-07-27 LAB — ABO/RH: ABO/RH(D): B POS

## 2018-07-27 MED ORDER — LIDOCAINE HCL (PF) 1 % IJ SOLN: 30.0000 mL | INTRAMUSCULAR | Status: DC | PRN

## 2018-07-27 MED ORDER — TERBUTALINE SULFATE 1 MG/ML IJ SOLN: 0.2500 mg | Freq: Once | INTRAMUSCULAR | Status: DC | PRN

## 2018-07-27 MED ORDER — OXYTOCIN 40 UNITS IN LACTATED RINGERS INFUSION - SIMPLE MED
1.0000 m[IU]/min | INTRAVENOUS | Status: DC
  Administered 2018-07-27: 2 m[IU]/min via INTRAVENOUS
  Administered 2018-07-28: 30 m[IU]/min via INTRAVENOUS
  Filled 2018-07-27 (×2): qty 1000

## 2018-07-27 MED ORDER — OXYTOCIN 40 UNITS IN LACTATED RINGERS INFUSION - SIMPLE MED: 2.5000 [IU]/h | INTRAVENOUS | Status: DC

## 2018-07-27 MED ORDER — MISOPROSTOL 25 MCG QUARTER TABLET
25.0000 ug | ORAL_TABLET | Freq: Once | ORAL | Status: DC
  Filled 2018-07-27: qty 1

## 2018-07-27 MED ORDER — MISOPROSTOL 25 MCG QUARTER TABLET
25.0000 ug | ORAL_TABLET | ORAL | Status: AC | PRN
  Administered 2018-07-27 (×3): 25 ug via VAGINAL
  Filled 2018-07-27 (×3): qty 1

## 2018-07-27 MED ORDER — ACETAMINOPHEN 325 MG PO TABS
650.0000 mg | ORAL_TABLET | ORAL | Status: DC | PRN
  Administered 2018-07-27 (×2): 650 mg via ORAL
  Filled 2018-07-27 (×2): qty 2

## 2018-07-27 MED ORDER — METFORMIN HCL 500 MG PO TABS
1000.0000 mg | ORAL_TABLET | Freq: Two times a day (BID) | ORAL | Status: DC
  Administered 2018-07-27: 1000 mg via ORAL
  Filled 2018-07-27 (×5): qty 2

## 2018-07-27 MED ORDER — OXYTOCIN BOLUS FROM INFUSION: 500.0000 mL | Freq: Once | INTRAVENOUS | Status: DC

## 2018-07-27 MED ORDER — SOD CITRATE-CITRIC ACID 500-334 MG/5ML PO SOLN
30.0000 mL | ORAL | Status: DC | PRN
  Administered 2018-07-28: 30 mL via ORAL
  Filled 2018-07-27: qty 15

## 2018-07-27 MED ORDER — LACTATED RINGERS IV SOLN
INTRAVENOUS | Status: DC
  Administered 2018-07-27 – 2018-07-28 (×4): via INTRAVENOUS

## 2018-07-27 MED ORDER — LACTATED RINGERS IV SOLN: 500.0000 mL | INTRAVENOUS | Status: DC | PRN

## 2018-07-27 MED ORDER — ONDANSETRON HCL 4 MG/2ML IJ SOLN: 4.0000 mg | Freq: Four times a day (QID) | INTRAMUSCULAR | Status: DC | PRN

## 2018-07-27 NOTE — Progress Notes (Signed)
S: Doing well, no complaints, pain well controlled, just crampy. Planning epidural.  S/p cytotec x 4, foley bulb still in place, pitocin not yet started.   O: BP 136/82   Pulse (!) 109   Temp 98.2 F (36.8 C) (Oral)   Resp 18   Ht 5\' 6"  (1.676 m)   Wt 132.9 kg   BMI 47.28 kg/m    FHT:  FHR: 145s bpm, variability: moderate,  accelerations:  Present,  decelerations:  Absent UC:   non3 SVE:   Dilation: 2 Effacement (%): 20 Station: Ballotable Exam by:: Dr. Ernestina PennaFogleman   A / P:  32 y.o.  OB History  Gravida Para Term Preterm AB Living  1 0 0 0 0 0  SAB TAB Ectopic Multiple Live Births  0 0 0 0 0   at 5111w0d IOL, gest htn. no PEC, bps stable. Slow progress, s/p cytotec x 4, miscommunication and pitocin only starting now. Continue pitocin, Consider AROM once foley out.  GDM, check BS q 4 hr, current 84  Fetal Wellbeing:  Category I Pain Control:  none needed, planning epidural  Anticipated MOD:  unclear, LGA  Lendon ColonelKelly A Manu Rubey 07/27/2018, 9:17 PM

## 2018-07-28 ENCOUNTER — Inpatient Hospital Stay (HOSPITAL_COMMUNITY): Payer: Commercial Managed Care - PPO | Admitting: Anesthesiology

## 2018-07-28 ENCOUNTER — Encounter (HOSPITAL_COMMUNITY): Payer: Self-pay

## 2018-07-28 ENCOUNTER — Encounter (HOSPITAL_COMMUNITY)
Admission: RE | Disposition: A | Discharge status: Home / Self Care | Payer: Self-pay | Source: Home / Self Care | Attending: Obstetrics & Gynecology

## 2018-07-28 DIAGNOSIS — O24419 Gestational diabetes mellitus in pregnancy, unspecified control: Secondary | ICD-10-CM

## 2018-07-28 DIAGNOSIS — Z98891 History of uterine scar from previous surgery: Secondary | ICD-10-CM

## 2018-07-28 DIAGNOSIS — O139 Gestational [pregnancy-induced] hypertension without significant proteinuria, unspecified trimester: Secondary | ICD-10-CM

## 2018-07-28 LAB — CBC
HCT: 31.6 % — ABNORMAL LOW (ref 36.0–46.0)
HEMATOCRIT: 31.2 % — AB (ref 36.0–46.0)
HEMOGLOBIN: 10.6 g/dL — AB (ref 12.0–15.0)
HEMOGLOBIN: 10.5 g/dL — AB (ref 12.0–15.0)
MCH: 29.3 pg (ref 26.0–34.0)
MCH: 29.8 pg (ref 26.0–34.0)
MCHC: 33.5 g/dL (ref 30.0–36.0)
MCHC: 33.7 g/dL (ref 30.0–36.0)
MCV: 87.3 fL (ref 78.0–100.0)
MCV: 88.6 fL (ref 78.0–100.0)
Platelets: 186 10*3/uL (ref 150–400)
Platelets: 171 10*3/uL (ref 150–400)
RBC: 3.62 MIL/uL — AB (ref 3.87–5.11)
RBC: 3.52 MIL/uL — ABNORMAL LOW (ref 3.87–5.11)
RDW: 15 % (ref 11.5–15.5)
RDW: 14.9 % (ref 11.5–15.5)
WBC: 13.4 10*3/uL — ABNORMAL HIGH (ref 4.0–10.5)
WBC: 13.8 10*3/uL — AB (ref 4.0–10.5)

## 2018-07-28 LAB — GLUCOSE, CAPILLARY
GLUCOSE-CAPILLARY: 75 mg/dL (ref 70–99)
Glucose-Capillary: 93 mg/dL (ref 70–99)
Glucose-Capillary: 93 mg/dL (ref 70–99)
Glucose-Capillary: 97 mg/dL (ref 70–99)

## 2018-07-28 LAB — RPR: RPR: NONREACTIVE

## 2018-07-28 SURGERY — Surgical Case
Anesthesia: Epidural

## 2018-07-28 MED ORDER — ALPRAZOLAM 0.5 MG PO TABS
0.5000 mg | ORAL_TABLET | Freq: Once | ORAL | Status: AC
  Administered 2018-07-28: 0.5 mg via ORAL
  Filled 2018-07-28: qty 1

## 2018-07-28 MED ORDER — LIDOCAINE-EPINEPHRINE (PF) 2 %-1:200000 IJ SOLN
INTRAMUSCULAR | Status: AC
  Filled 2018-07-28: qty 20

## 2018-07-28 MED ORDER — DEXAMETHASONE SODIUM PHOSPHATE 4 MG/ML IJ SOLN
INTRAMUSCULAR | Status: DC | PRN
  Administered 2018-07-28: 4 mg via INTRAVENOUS

## 2018-07-28 MED ORDER — KETOROLAC TROMETHAMINE 30 MG/ML IJ SOLN
30.0000 mg | Freq: Once | INTRAMUSCULAR | Status: AC | PRN
  Administered 2018-07-28: 30 mg via INTRAVENOUS

## 2018-07-28 MED ORDER — DIPHENHYDRAMINE HCL 50 MG/ML IJ SOLN: 12.5000 mg | INTRAMUSCULAR | Status: DC | PRN

## 2018-07-28 MED ORDER — PHENYLEPHRINE 40 MCG/ML (10ML) SYRINGE FOR IV PUSH (FOR BLOOD PRESSURE SUPPORT): 80.0000 ug | PREFILLED_SYRINGE | INTRAVENOUS | Status: DC | PRN

## 2018-07-28 MED ORDER — LACTATED RINGERS IV SOLN
INTRAVENOUS | Status: DC | PRN
  Administered 2018-07-28: 14:00:00 via INTRAVENOUS

## 2018-07-28 MED ORDER — ONDANSETRON HCL 4 MG/2ML IJ SOLN
INTRAMUSCULAR | Status: DC | PRN
  Administered 2018-07-28: 4 mg via INTRAVENOUS

## 2018-07-28 MED ORDER — SIMETHICONE 80 MG PO CHEW
80.0000 mg | CHEWABLE_TABLET | ORAL | Status: DC
  Administered 2018-07-28 – 2018-07-29 (×2): 80 mg via ORAL
  Filled 2018-07-28 (×2): qty 1

## 2018-07-28 MED ORDER — TRIAMCINOLONE ACETONIDE 40 MG/ML IJ SUSP
40.0000 mg | Freq: Once | INTRAMUSCULAR | Status: DC
  Filled 2018-07-28: qty 1

## 2018-07-28 MED ORDER — PHENYLEPHRINE 40 MCG/ML (10ML) SYRINGE FOR IV PUSH (FOR BLOOD PRESSURE SUPPORT)
80.0000 ug | PREFILLED_SYRINGE | INTRAVENOUS | Status: DC | PRN
  Filled 2018-07-28: qty 10

## 2018-07-28 MED ORDER — MORPHINE SULFATE (PF) 0.5 MG/ML IJ SOLN
INTRAMUSCULAR | Status: AC
  Filled 2018-07-28: qty 10

## 2018-07-28 MED ORDER — MENTHOL 3 MG MT LOZG: 1.0000 | LOZENGE | OROMUCOSAL | Status: DC | PRN

## 2018-07-28 MED ORDER — EPHEDRINE 5 MG/ML INJ: 10.0000 mg | INTRAVENOUS | Status: DC | PRN

## 2018-07-28 MED ORDER — DIPHENHYDRAMINE HCL 25 MG PO CAPS
25.0000 mg | ORAL_CAPSULE | Freq: Four times a day (QID) | ORAL | Status: DC | PRN
  Administered 2018-07-28: 25 mg via ORAL
  Filled 2018-07-28: qty 1

## 2018-07-28 MED ORDER — ACETAMINOPHEN 325 MG PO TABS: 650.0000 mg | ORAL_TABLET | ORAL | Status: DC | PRN

## 2018-07-28 MED ORDER — FERROUS GLUCONATE 324 (38 FE) MG PO TABS
324.0000 mg | ORAL_TABLET | Freq: Every day | ORAL | Status: DC
  Administered 2018-07-29 – 2018-07-30 (×2): 324 mg via ORAL
  Filled 2018-07-28 (×3): qty 1

## 2018-07-28 MED ORDER — PHENYLEPHRINE 40 MCG/ML (10ML) SYRINGE FOR IV PUSH (FOR BLOOD PRESSURE SUPPORT)
PREFILLED_SYRINGE | INTRAVENOUS | Status: AC
  Filled 2018-07-28: qty 10

## 2018-07-28 MED ORDER — OXYCODONE-ACETAMINOPHEN 5-325 MG PO TABS: 2.0000 | ORAL_TABLET | ORAL | Status: DC | PRN

## 2018-07-28 MED ORDER — MORPHINE SULFATE (PF) 0.5 MG/ML IJ SOLN
INTRAMUSCULAR | Status: DC | PRN
  Administered 2018-07-28: 3 mg via EPIDURAL

## 2018-07-28 MED ORDER — SCOPOLAMINE 1 MG/3DAYS TD PT72
MEDICATED_PATCH | TRANSDERMAL | Status: AC
  Filled 2018-07-28: qty 1

## 2018-07-28 MED ORDER — SODIUM BICARBONATE 8.4 % IV SOLN
INTRAVENOUS | Status: DC | PRN
  Administered 2018-07-28 (×2): 5 mL via EPIDURAL

## 2018-07-28 MED ORDER — FENTANYL 2.5 MCG/ML BUPIVACAINE 1/10 % EPIDURAL INFUSION (WH - ANES)
14.0000 mL/h | INTRAMUSCULAR | Status: DC | PRN
  Administered 2018-07-28: 14 mL/h via EPIDURAL
  Filled 2018-07-28: qty 100

## 2018-07-28 MED ORDER — ONDANSETRON HCL 4 MG/2ML IJ SOLN
INTRAMUSCULAR | Status: AC
  Filled 2018-07-28: qty 2

## 2018-07-28 MED ORDER — SIMETHICONE 80 MG PO CHEW: 80.0000 mg | CHEWABLE_TABLET | ORAL | Status: DC | PRN

## 2018-07-28 MED ORDER — TETANUS-DIPHTH-ACELL PERTUSSIS 5-2.5-18.5 LF-MCG/0.5 IM SUSP: 0.5000 mL | Freq: Once | INTRAMUSCULAR | Status: DC

## 2018-07-28 MED ORDER — LACTATED RINGERS IV SOLN: INTRAVENOUS | Status: DC

## 2018-07-28 MED ORDER — KETOROLAC TROMETHAMINE 30 MG/ML IJ SOLN
INTRAMUSCULAR | Status: AC
  Filled 2018-07-28: qty 1

## 2018-07-28 MED ORDER — PANTOPRAZOLE SODIUM 40 MG PO TBEC
40.0000 mg | DELAYED_RELEASE_TABLET | Freq: Every day | ORAL | Status: DC
  Administered 2018-07-28 – 2018-07-30 (×3): 40 mg via ORAL
  Filled 2018-07-28 (×3): qty 1

## 2018-07-28 MED ORDER — IBUPROFEN 600 MG PO TABS
600.0000 mg | ORAL_TABLET | Freq: Four times a day (QID) | ORAL | Status: DC
  Administered 2018-07-28 – 2018-07-30 (×7): 600 mg via ORAL
  Filled 2018-07-28 (×7): qty 1

## 2018-07-28 MED ORDER — SENNOSIDES-DOCUSATE SODIUM 8.6-50 MG PO TABS
2.0000 | ORAL_TABLET | ORAL | Status: DC
  Administered 2018-07-28 – 2018-07-29 (×2): 2 via ORAL
  Filled 2018-07-28 (×2): qty 2

## 2018-07-28 MED ORDER — PANTOPRAZOLE SODIUM 40 MG PO TBEC: 40.0000 mg | DELAYED_RELEASE_TABLET | Freq: Every day | ORAL | Status: DC

## 2018-07-28 MED ORDER — OXYTOCIN 10 UNIT/ML IJ SOLN
INTRAMUSCULAR | Status: AC
  Filled 2018-07-28: qty 4

## 2018-07-28 MED ORDER — TRIAMCINOLONE ACETONIDE 40 MG/ML IJ SUSP
INTRAMUSCULAR | Status: DC | PRN
  Administered 2018-07-28: 40 mg via INTRAMUSCULAR

## 2018-07-28 MED ORDER — ACETAMINOPHEN 10 MG/ML IV SOLN: 1000.0000 mg | Freq: Once | INTRAVENOUS | Status: DC | PRN

## 2018-07-28 MED ORDER — PRENATAL MULTIVITAMIN CH
1.0000 | ORAL_TABLET | Freq: Every day | ORAL | Status: DC
  Administered 2018-07-29 – 2018-07-30 (×2): 1 via ORAL
  Filled 2018-07-28 (×2): qty 1

## 2018-07-28 MED ORDER — OXYTOCIN 10 UNIT/ML IJ SOLN
INTRAVENOUS | Status: DC | PRN
  Administered 2018-07-28: 80 [IU] via INTRAVENOUS

## 2018-07-28 MED ORDER — SIMETHICONE 80 MG PO CHEW
80.0000 mg | CHEWABLE_TABLET | Freq: Three times a day (TID) | ORAL | Status: DC
  Administered 2018-07-28 – 2018-07-30 (×6): 80 mg via ORAL
  Filled 2018-07-28 (×6): qty 1

## 2018-07-28 MED ORDER — LIDOCAINE HCL (PF) 1 % IJ SOLN
INTRAMUSCULAR | Status: DC | PRN
  Administered 2018-07-28: 13 mL via EPIDURAL

## 2018-07-28 MED ORDER — DEXTROSE 5 % IV SOLN
INTRAVENOUS | Status: AC
  Filled 2018-07-28: qty 3000

## 2018-07-28 MED ORDER — LIDOCAINE HCL 2 % IJ SOLN
INTRAMUSCULAR | Status: DC | PRN
  Administered 2018-07-28: 20 mL

## 2018-07-28 MED ORDER — DEXAMETHASONE SODIUM PHOSPHATE 4 MG/ML IJ SOLN
INTRAMUSCULAR | Status: AC
  Filled 2018-07-28: qty 1

## 2018-07-28 MED ORDER — COCONUT OIL OIL: 1.0000 "application " | TOPICAL_OIL | Status: DC | PRN

## 2018-07-28 MED ORDER — METFORMIN HCL 500 MG PO TABS
1000.0000 mg | ORAL_TABLET | Freq: Two times a day (BID) | ORAL | Status: DC
  Administered 2018-07-29 – 2018-07-30 (×3): 1000 mg via ORAL
  Filled 2018-07-28 (×5): qty 2

## 2018-07-28 MED ORDER — DIBUCAINE 1 % RE OINT: 1.0000 "application " | TOPICAL_OINTMENT | RECTAL | Status: DC | PRN

## 2018-07-28 MED ORDER — ALBUTEROL SULFATE (2.5 MG/3ML) 0.083% IN NEBU: 3.0000 mL | INHALATION_SOLUTION | RESPIRATORY_TRACT | Status: DC | PRN

## 2018-07-28 MED ORDER — DEXTROSE 5 % IV SOLN
3.0000 g | Freq: Once | INTRAVENOUS | Status: AC
  Administered 2018-07-28: 3 g via INTRAVENOUS
  Filled 2018-07-28: qty 3000

## 2018-07-28 MED ORDER — LIDOCAINE HCL 2 % IJ SOLN
INTRAMUSCULAR | Status: AC
  Filled 2018-07-28: qty 40

## 2018-07-28 MED ORDER — OXYTOCIN 40 UNITS IN LACTATED RINGERS INFUSION - SIMPLE MED
2.5000 [IU]/h | INTRAVENOUS | Status: AC
  Filled 2018-07-28: qty 1000

## 2018-07-28 MED ORDER — WITCH HAZEL-GLYCERIN EX PADS: 1.0000 "application " | MEDICATED_PAD | CUTANEOUS | Status: DC | PRN

## 2018-07-28 MED ORDER — LACTATED RINGERS IV SOLN
500.0000 mL | Freq: Once | INTRAVENOUS | Status: AC
  Administered 2018-07-28: 500 mL via INTRAVENOUS

## 2018-07-28 MED ORDER — ESCITALOPRAM OXALATE 20 MG PO TABS
20.0000 mg | ORAL_TABLET | Freq: Every day | ORAL | Status: DC
  Administered 2018-07-28 – 2018-07-30 (×3): 20 mg via ORAL
  Filled 2018-07-28 (×4): qty 1

## 2018-07-28 MED ORDER — SCOPOLAMINE 1 MG/3DAYS TD PT72
MEDICATED_PATCH | TRANSDERMAL | Status: DC | PRN
  Administered 2018-07-28: 1 via TRANSDERMAL

## 2018-07-28 MED ORDER — OXYCODONE-ACETAMINOPHEN 5-325 MG PO TABS: 1.0000 | ORAL_TABLET | ORAL | Status: DC | PRN

## 2018-07-28 MED ORDER — CEFAZOLIN SODIUM-DEXTROSE 2-4 GM/100ML-% IV SOLN: 2.0000 g | Freq: Once | INTRAVENOUS | Status: DC

## 2018-07-28 MED ORDER — LACTATED RINGERS IV SOLN
INTRAVENOUS | Status: DC | PRN
  Administered 2018-07-28 (×3): via INTRAVENOUS

## 2018-07-28 MED ORDER — METOCLOPRAMIDE HCL 5 MG/ML IJ SOLN
INTRAMUSCULAR | Status: DC | PRN
  Administered 2018-07-28: 10 mg via INTRAVENOUS

## 2018-07-28 SURGICAL SUPPLY — 38 items
APL SKNCLS STERI-STRIP NONHPOA (GAUZE/BANDAGES/DRESSINGS) ×1
BENZOIN TINCTURE PRP APPL 2/3 (GAUZE/BANDAGES/DRESSINGS) ×2 IMPLANT
CHLORAPREP W/TINT 26ML (MISCELLANEOUS) ×3 IMPLANT
CLAMP CORD UMBIL (MISCELLANEOUS) IMPLANT
CLOSURE WOUND 1/2 X4 (GAUZE/BANDAGES/DRESSINGS) ×1
CLOTH BEACON ORANGE TIMEOUT ST (SAFETY) ×3 IMPLANT
DRSG OPSITE POSTOP 4X10 (GAUZE/BANDAGES/DRESSINGS) ×3 IMPLANT
ELECT REM PT RETURN 9FT ADLT (ELECTROSURGICAL) ×3
ELECTRODE REM PT RTRN 9FT ADLT (ELECTROSURGICAL) ×1 IMPLANT
EXTRACTOR VACUUM M CUP 4 TUBE (SUCTIONS) IMPLANT
EXTRACTOR VACUUM M CUP 4' TUBE (SUCTIONS)
GLOVE BIO SURGEON STRL SZ 6.5 (GLOVE) ×2 IMPLANT
GLOVE BIO SURGEONS STRL SZ 6.5 (GLOVE) ×1
GLOVE BIOGEL PI IND STRL 7.0 (GLOVE) ×2 IMPLANT
GLOVE BIOGEL PI INDICATOR 7.0 (GLOVE) ×4
GOWN STRL REUS W/TWL LRG LVL3 (GOWN DISPOSABLE) ×6 IMPLANT
KIT ABG SYR 3ML LUER SLIP (SYRINGE) IMPLANT
NDL HYPO 25X5/8 SAFETYGLIDE (NEEDLE) IMPLANT
NEEDLE HYPO 22GX1.5 SAFETY (NEEDLE) IMPLANT
NEEDLE HYPO 25X5/8 SAFETYGLIDE (NEEDLE) IMPLANT
NS IRRIG 1000ML POUR BTL (IV SOLUTION) ×3 IMPLANT
PACK C SECTION WH (CUSTOM PROCEDURE TRAY) ×3 IMPLANT
PAD OB MATERNITY 4.3X12.25 (PERSONAL CARE ITEMS) ×3 IMPLANT
PENCIL SMOKE EVAC W/HOLSTER (ELECTROSURGICAL) ×3 IMPLANT
SPONGE LAP 18X18 RF (DISPOSABLE) ×9 IMPLANT
STRIP CLOSURE SKIN 1/2X4 (GAUZE/BANDAGES/DRESSINGS) ×1 IMPLANT
SUT MON AB 4-0 PS1 27 (SUTURE) ×3 IMPLANT
SUT PLAIN 0 NONE (SUTURE) IMPLANT
SUT PLAIN 2 0 XLH (SUTURE) IMPLANT
SUT VIC AB 0 CT1 36 (SUTURE) ×8 IMPLANT
SUT VIC AB 0 CTX 36 (SUTURE) ×6
SUT VIC AB 0 CTX36XBRD ANBCTRL (SUTURE) ×2 IMPLANT
SUT VIC AB 2-0 CT1 27 (SUTURE) ×3
SUT VIC AB 2-0 CT1 TAPERPNT 27 (SUTURE) ×1 IMPLANT
SUT VIC AB 4-0 KS 27 (SUTURE) ×2 IMPLANT
SYR CONTROL 10ML LL (SYRINGE) IMPLANT
TOWEL OR 17X24 6PK STRL BLUE (TOWEL DISPOSABLE) ×3 IMPLANT
TRAY FOLEY W/BAG SLVR 14FR LF (SET/KITS/TRAYS/PACK) IMPLANT

## 2018-07-28 NOTE — Progress Notes (Signed)
S: Doing well, no complaints, pain well controlled with epidural. Pt was able to sleep after epidural and xanax.   O: BP 125/81   Pulse (!) 111   Temp 98.2 F (36.8 C) (Oral)   Resp 18   Ht 5\' 6"  (1.676 m)   Wt 132.9 kg   SpO2 99%   BMI 47.28 kg/m    FHT:  FHR: 150s bpm, variability: moderate,  accelerations:  Present,  decelerations:  Absent UC:   regular, every 4 minutes SVE:   Dilation: 4.5 Effacement (%): 50 Station: -3 Exam by:: Dr. Ernestina PennaFogleman   A / P:  32 y.o.  OB History  Gravida Para Term Preterm AB Living  1 0 0 0 0 0  SAB TAB Ectopic Multiple Live Births  0 0 0 0 0   at 3274w1d IOL, gest htn, anxiety, known LGA. slow progress. not adequate despite cytotec x 4, foley bulb an dnow pitocin at 30 m[place]i/s /min for the past 2.5 hrs, will plan pitocin break x 2 hr then resume pitocin. IUPC now in place.   Fetal Wellbeing:  Category I Pain Control:  Epidural  Anticipated MOD:  unclear  Lendon ColonelKelly A Domanique Luckett 07/28/2018, 8:53 AM

## 2018-07-28 NOTE — Anesthesia Postprocedure Evaluation (Signed)
Anesthesia Post Note  Patient: Jody Crane  Procedure(s) Performed: CESAREAN SECTION (N/A )     Patient location during evaluation: PACU Anesthesia Type: Epidural Level of consciousness: awake and alert Pain management: pain level controlled Vital Signs Assessment: post-procedure vital signs reviewed and stable Respiratory status: spontaneous breathing, nonlabored ventilation and respiratory function stable Cardiovascular status: stable Postop Assessment: no headache, no backache and epidural receding Anesthetic complications: no    Last Vitals:  Vitals:   07/28/18 1546 07/28/18 1558  BP: (!) 148/89 (!) 154/92  Pulse: (!) 109 (!) 114  Resp: 17 18  Temp:  36.9 C  SpO2:  96%    Last Pain:  Vitals:   07/28/18 1558  TempSrc: Oral  PainSc:    Pain Goal:                 Chelsey L Woodrum

## 2018-07-28 NOTE — Op Note (Signed)
07/27/2018 - 07/28/2018  2:15 PM  PATIENT:  Jody Crane  32 y.o. female  PRE-OPERATIVE DIAGNOSIS:  Primary cesarean section for fetal tachycardia and arrest of dilation  POST-OPERATIVE DIAGNOSIS:  Primary cesarean section for fetal tachycardia and arrest of dilation  PROCEDURE:  Procedure(s): CESAREAN SECTION (N/A)  LTCS, 2 layer closure   SURGEON:  Surgeon(s) and Role:    * Noland FordyceFogleman, Zakiyah Diop, MD - Primary  PHYSICIAN ASSISTANT:   ASSISTANTS: Sigmon, CNM   ANESTHESIA:   epidural  EBL: per nursing notes  BLOOD ADMINISTERED:none  DRAINS: Urinary Catheter (Foley)   LOCAL MEDICATIONS USED:  LIDOCAINE  and OTHER 40cc Kenalog  SPECIMEN:  Source of Specimen:  placenta  DISPOSITION OF SPECIMEN:  PATHOLOGY  COUNTS:  YES  TOURNIQUET:  * No tourniquets in log *  DICTATION: .Note written in EPIC  PLAN OF CARE: Admit to inpatient   PATIENT DISPOSITION:  PACU - hemodynamically stable.   Delay start of Pharmacological VTE agent (>24hrs) due to surgical blood loss or risk of bleeding: yes     Findings:  @BABYSEXEBC @ infant,  APGAR (1 MIN): 8   APGAR (5 MINS): 9   APGAR (10 MINS):   Normal uterus, tubes and ovaries, normal placenta. 3VC, clear amniotic fluid  EBL: per nursing notes Antibiotics:   3g Ancef Complications: none  Indications: This is a 32 y.o. year-old, G1  At 1017w1d admitted for IOL due to gestational hypertension. No progress despite all induction meds, foley and adequate contractions by MVU. Also known LGA and then development of fetal tachycardia w/o fever. Risks benefits and alternatives of the procedure were discussed with the patient who agreed to proceed  Procedure:  After informed consent was obtained the patient was taken to the operating room where epidural anesthesia was found to be adequate.  She was prepped and draped in the normal sterile fashion in dorsal supine position with a leftward tilt.  A foley catheter was in place.  A Pfannenstiel skin  incision was made 2 cm above the pubic symphysis in the midline with the scalpel.  Dissection was carried down with the Bovie cautery until the fascia was reached. The fascia was incised in the midline. The incision was extended laterally with the Mayo scissors. The inferior aspect of the fascial incision was grasped with the Coker clamps, elevated up and the underlying rectus muscles were dissected off sharply. The superior aspect of the fascial incision was grasped with the Coker clamps elevated up and the underlying rectus muscles were dissected off sharply.  The peritoneum was entered bluntly. The peritoneal incision was extended superiorly and inferiorly with good visualization of the bladder. The bladder blade was inserted and palpation was done to assess the fetal position and the location of the uterine vessels. The lower segment of the uterus was incised sharply with the scalpel and extended  bluntly in the cephalo-caudal fashion. The infant was grasped, brought to the incision,  rotated and the infant was delivered with fundal pressure. The nose and mouth were bulb suctioned. The cord was clamped and cut after 1 minute delay. The infant was handed off to the waiting pediatrician. The placenta was expressed. The uterus was exteriorized. The uterus was cleared of all clots and debris. The uterine incision was repaired with 0 Vicryl in a running locked fashion.  A second layer of the same suture was used in an imbricating fashion to obtain excellent hemostasis. Several additional figure of 8s in the midpoint of the incision for hemostasis. The  uterus was then returned to the abdomen, the gutters were cleared of all clots and debris. The uterine incision was reinspected and found to be hemostatic. The peritoneum was grasped and closed with 2-0 Vicryl in a running fashion. The cut muscle edges and the underside of the fascia were inspected and found to be hemostatic. The fascia was closed with 0 Vicryl in a  single layer. The subcutaneous tissue was irrigated. Scarpa's layer was closed with a 2-0 plain gut suture. The skin was closed with a 4-0 Vicryl in a single layer. The patient tolerated the procedure well. Sponge lap and needle counts were correct x3 and patient was taken to the recovery room in a stable condition.  Lendon Colonel 07/28/2018 2:18 PM

## 2018-07-28 NOTE — Progress Notes (Signed)
S: Doing well, no complaints, pain well controlled with epidural  O: BP 119/71   Pulse (!) 114   Temp 98.9 F (37.2 C) (Oral)   Resp 18   Ht 5\' 6"  (1.676 m)   Wt 132.9 kg   SpO2 99%   BMI 47.28 kg/m    FHT:  FHR: 180s bpm, variability: moderate,  accelerations:  Present,  decelerations:  Absent UC:   regular, every 6 minutes SVE:   Dilation: 3 Effacement (%): 50 Station: -3 Exam by:: Sandy SalaamLynnsey Johnson, RN   A / P:  32 y.o.  OB History  Gravida Para Term Preterm AB Living  1 0 0 0 0 0  SAB TAB Ectopic Multiple Live Births  0 0 0 0 0   at 333w1d Arrest of dilation, fetal tachcardia. Pt with adequate contractions on pitocin, up to 30 munit/ min but no cervcical change over 12 hrs, known LGA and now with fetal tachycardia. Move to delivery  Fetal Wellbeing:  Category II Pain Control:  Epidural  Anticipated MOD:  PCS  Alphonsus SiasKelly A Rama Sorci 07/28/2018, 12:22 PM

## 2018-07-28 NOTE — Anesthesia Preprocedure Evaluation (Signed)
Anesthesia Evaluation    Airway Mallampati: II  TM Distance: >3 FB Neck ROM: Full    Dental no notable dental hx.    Pulmonary former smoker,    Pulmonary exam normal breath sounds clear to auscultation       Cardiovascular Normal cardiovascular exam Rhythm:Regular Rate:Normal     Neuro/Psych    GI/Hepatic   Endo/Other  diabetesMorbid obesity  Renal/GU      Musculoskeletal   Abdominal (+) + obese,   Peds  Hematology   Anesthesia Other Findings   Reproductive/Obstetrics (+) Pregnancy                             Anesthesia Physical Anesthesia Plan  ASA: III  Anesthesia Plan: Epidural   Post-op Pain Management:    Induction:   PONV Risk Score and Plan:   Airway Management Planned:   Additional Equipment:   Intra-op Plan:   Post-operative Plan:   Informed Consent:   Plan Discussed with:   Anesthesia Plan Comments:         Anesthesia Quick Evaluation

## 2018-07-28 NOTE — Progress Notes (Signed)
MOB was referred for history of depression/anxiety. * Referral screened out by Clinical Social Worker because none of the following criteria appear to apply: ~ History of anxiety/depression during this pregnancy, or of post-partum depression following prior delivery. ~ Diagnosis of anxiety and/or depression within last 3 years OR * MOB's symptoms currently being treated with medication and/or therapy. Please contact the Clinical Social Worker if needs arise, by MOB request, or if MOB scores greater than 9/yes to question 10 on Edinburgh Postpartum Depression Screen.  Calliope Delangel, LCSW Clinical Social Worker  [facility]  [phone]  

## 2018-07-28 NOTE — Brief Op Note (Signed)
07/27/2018 - 07/28/2018  2:15 PM  PATIENT:  Jody Crane  32 y.o. female  PRE-OPERATIVE DIAGNOSIS:  Primary cesarean section for fetal tachycardia and arrest of dilation  POST-OPERATIVE DIAGNOSIS:  Primary cesarean section for fetal tachycardia and arrest of dilation  PROCEDURE:  Procedure(s): CESAREAN SECTION (N/A)  LTCS, 2 layer closure   SURGEON:  Surgeon(s) and Role:    * Noland FordyceFogleman, Lennie Dunnigan, MD - Primary  PHYSICIAN ASSISTANT:   ASSISTANTS: Sigmon, CNM   ANESTHESIA:   epidural  EBL: per nursing notes  BLOOD ADMINISTERED:none  DRAINS: Urinary Catheter (Foley)   LOCAL MEDICATIONS USED:  LIDOCAINE  and OTHER 40cc Kenalog  SPECIMEN:  Source of Specimen:  placenta  DISPOSITION OF SPECIMEN:  PATHOLOGY  COUNTS:  YES  TOURNIQUET:  * No tourniquets in log *  DICTATION: .Note written in EPIC  PLAN OF CARE: Admit to inpatient   PATIENT DISPOSITION:  PACU - hemodynamically stable.   Delay start of Pharmacological VTE agent (>24hrs) due to surgical blood loss or risk of bleeding: yes

## 2018-07-28 NOTE — Anesthesia Procedure Notes (Signed)
Epidural Patient location during procedure: OB Start time: 07/28/2018 3:55 AM End time: 07/28/2018 4:12 AM  Staffing Anesthesiologist: Lowella CurbMiller, Waldon Sheerin Ray, MD Performed: anesthesiologist   Preanesthetic Checklist Completed: patient identified, site marked, surgical consent, pre-op evaluation, timeout performed, IV checked, risks and benefits discussed and monitors and equipment checked  Epidural Patient position: sitting Prep: ChloraPrep Patient monitoring: heart rate, cardiac monitor, continuous pulse ox and blood pressure Approach: midline Location: L2-L3 Injection technique: LOR saline  Needle:  Needle type: Tuohy  Needle gauge: 17 G Needle length: 9 cm Needle insertion depth: 7 cm Catheter type: closed end flexible Catheter size: 20 Guage Catheter at skin depth: 12 cm Test dose: negative  Assessment Events: blood not aspirated, injection not painful, no injection resistance, negative IV test and no paresthesia  Additional Notes Reason for block:procedure for pain

## 2018-07-28 NOTE — Progress Notes (Signed)
S: Doing well, no complaints, pain well  controlled as not yet feeling contractions. planning epidural, asking for something to help sleep.   O: BP 138/89   Pulse 97   Temp 98.7 F (37.1 C) (Oral)   Resp 18   Ht 5\' 6"  (1.676 m)   Wt 132.9 kg   BMI 47.28 kg/m    FHT:  FHR: 140s bpm, variability: moderate,  accelerations:  Present,  decelerations:  Absent UC:   irritibility SVE:   Dilation: 4 Effacement (%): 20 Station: -3 Exam by:: Murrel Freet AROM clear  A / P:  32 y.o.  OB History  Gravida Para Term Preterm AB Living  1 0 0 0 0 0  SAB TAB Ectopic Multiple Live Births  0 0 0 0 0   at 2770w1d IOL, gest htn, slow progress, cont pitocin, may titrate up to 6830munits/ min  Fetal Wellbeing:  Category I Pain Control:  Labor support without medications  Anticipated MOD:  unclear  Lendon ColonelKelly A Dezyrae Kensinger 07/28/2018, 1:32 AM

## 2018-07-28 NOTE — Transfer of Care (Signed)
Immediate Anesthesia Transfer of Care Note  Patient: Jody Crane  Procedure(s) Performed: CESAREAN SECTION (N/A )  Patient Location: PACU  Anesthesia Type:Epidural  Level of Consciousness: awake and alert   Airway & Oxygen Therapy: Patient Spontanous Breathing  Post-op Assessment: Report given to RN and Post -op Vital signs reviewed and stable  Post vital signs:   Last Vitals:  Vitals Value Taken Time  BP 133/81 07/28/2018  2:39 PM  Temp 36.8 C 07/28/2018  2:40 PM  Pulse 110 07/28/2018  2:44 PM  Resp 17 07/28/2018  2:44 PM  SpO2 97 % 07/28/2018  2:44 PM  Vitals shown include unvalidated device data.  Last Pain:  Vitals:   07/28/18 1440  TempSrc: Oral  PainSc: 0-No pain         Complications: No apparent anesthesia complications

## 2018-07-29 ENCOUNTER — Encounter (HOSPITAL_COMMUNITY): Payer: Self-pay | Admitting: Obstetrics

## 2018-07-29 LAB — CBC
HEMATOCRIT: 29.9 % — AB (ref 36.0–46.0)
HEMOGLOBIN: 9.8 g/dL — AB (ref 12.0–15.0)
MCH: 29.1 pg (ref 26.0–34.0)
MCHC: 32.8 g/dL (ref 30.0–36.0)
MCV: 88.7 fL (ref 78.0–100.0)
Platelets: 176 10*3/uL (ref 150–400)
RBC: 3.37 MIL/uL — AB (ref 3.87–5.11)
RDW: 14.8 % (ref 11.5–15.5)
WBC: 13.1 10*3/uL — AB (ref 4.0–10.5)

## 2018-07-29 MED ORDER — MAGNESIUM OXIDE 400 (241.3 MG) MG PO TABS
400.0000 mg | ORAL_TABLET | Freq: Every day | ORAL | Status: DC
  Administered 2018-07-29 – 2018-07-30 (×2): 400 mg via ORAL
  Filled 2018-07-29 (×3): qty 1

## 2018-07-29 NOTE — Lactation Note (Addendum)
This note was copied from a baby's chart. Lactation Consultation Note  Patient Name: Jody Crane Reason for consult: Initial assessment;Early term 37-38.6wks   P1, Baby 9 hours old.  4625w1d.  Mother has history of GDM and GHTN. Baby was recently supplemented w/ formula per RN due to difficulty latching, crying, jittery and redness. Last BS 41.   Baby still cueing when lactation consultant entered room. Mother's nipples evert and compressible.  Mother able to easily hand express good flow of colostrum which was spoon fed to baby. LC assisted mother w/ latching.  Had mother hand express and prepump which everted nipples further. With assistance baby latched in both cross cradle and football hold. Worked with mother on breast depth.  Baby breastfed on both breasts for approx 30 min. RN had set up DEBP in room.  LC provided instruction and had mother pump for approx 10 min. Discussed cleaning and room temp breastmilk storage. Parents appreciative of assistance.  Baby starting to cue again so baby was returned to breast.  Mother prefers football hold. Recommend hand expressing and prepumping w/ manual pump prior to latching if able. Pump when mother is able. Mom encouraged to feed baby 8-12 times/24 hours and with feeding cues.  Encouraged skin to skin.  Discussed basics. Baby remained on breast when LC left room.   Mom made aware of O/P services, breastfeeding support groups, community resources, and our phone # for post-discharge questions.         Maternal Data Has patient been taught Hand Expression?: Yes Does the patient have breastfeeding experience prior to this delivery?: No  Feeding Feeding Type: Breast Fed Length of feed: 30 min  LATCH Score Latch: Grasps breast easily, tongue down, lips flanged, rhythmical sucking.  Audible Swallowing: A few with stimulation  Type of Nipple: Everted at rest and after stimulation  Comfort  (Breast/Nipple): Soft / non-tender  Hold (Positioning): Assistance needed to correctly position infant at breast and maintain latch.  LATCH Score: 8  Interventions Interventions: Breast feeding basics reviewed;Assisted with latch;Skin to skin;Breast massage;Hand express;Pre-pump if needed;Breast compression;Adjust position;Support pillows;Position options;Expressed milk;DEBP  Lactation Tools Discussed/Used Tools: Pump;Nipple Shields Nipple shield size: 20 Breast pump type: Double-Electric Breast Pump Pump Review: Setup, frequency, and cleaning;Milk Storage Initiated by:: RN & Dahlia Byesuth Sriyan Cutting  Date initiated:: 07/28/18   Consult Status Consult Status: Follow-up Date: 07/30/18 Follow-up type: In-patient    Dahlia ByesBerkelhammer, Tilak Oakley [facility] Crane, 12:04 AM

## 2018-07-29 NOTE — Anesthesia Postprocedure Evaluation (Signed)
Anesthesia Post Note  Patient: Jody Crane  Procedure(s) Performed: CESAREAN SECTION (N/A )     Patient location during evaluation: Mother Baby Anesthesia Type: Epidural Level of consciousness: awake and alert Pain management: pain level controlled Vital Signs Assessment: post-procedure vital signs reviewed and stable Respiratory status: spontaneous breathing, nonlabored ventilation and respiratory function stable Cardiovascular status: stable Postop Assessment: no headache, no backache, epidural receding, patient able to bend at knees, no apparent nausea or vomiting, able to ambulate and adequate PO intake Anesthetic complications: no    Last Vitals:  Vitals:   07/29/18 0130 07/29/18 0345  BP:  127/72  Pulse:  71  Resp:  18  Temp:  36.6 C  SpO2: 95% 95%    Last Pain:  Vitals:   07/29/18 0345  TempSrc: Oral  PainSc:    Pain Goal:                 Laban EmperorMalinova,Joshua Zeringue Hristova

## 2018-07-29 NOTE — Addendum Note (Signed)
Addendum  created 07/29/18 0835 by Elgie CongoMalinova, Jennalynn Rivard H, CRNA   Charge Capture section accepted, Sign clinical note, Visit diagnoses modified

## 2018-07-29 NOTE — Progress Notes (Addendum)
POSTOPERATIVE DAY # 1 S/P [facility] for arrest of dilation and fetal tachycardia, baby girl "Maggie"  IOL on 9/20 for GHTN   S:         Reports feeling tired and sore; has some slight itching  Denies HA, visual changes, or RUQ/epigastric pain             Tolerating po intake / no nausea / no vomiting / + flatus / no BM  Denies dizziness, SOB, or CP             Bleeding is light             Pain controlled with Motrin             Up ad lib / ambulatory/ foley catheter just removed  Newborn breastfeeding with formula supplementation - per mom states latching is going well; baby appears jittery.    O:  VS: BP 120/71 (BP Location: Left Arm)   Pulse (!) 101   Temp 98.6 F (37 C) (Oral)   Resp 18   Ht 5\' 6"  (1.676 m)   Wt 132.9 kg   SpO2 98%   Breastfeeding? Unknown   BMI 47.28 kg/m    LABS:               Recent Labs    07/28/18 1525 07/29/18 0536  WBC 13.8* 13.1*  HGB 10.5* 9.8*  PLT 171 176               Bloodtype: --/--/B POS, B POS Performed at [facility], [address] [place], [place] [address]  [phone](09/22 0110)  Rubella: Immune (07/02 0000)                                             I&O: Intake/Output      09/23 0701 - 09/24 0700 09/24 0701 - 09/25 0700   P.O. 1200    I.V. (mL/kg) 2486 (18.7)    Other 1000    Total Intake(mL/kg) 4686 (35.3)    Urine (mL/kg/hr) 2175 (0.7) 1400 (1.2)   Emesis/NG output 900    Blood 457    Total Output 3532 1400   Net +1154 -1400                     Physical Exam:             Alert and Oriented X3  Lungs: Clear and unlabored  Heart: regular rate and rhythm / no murmurs  Abdomen: soft, non-tender, non-distended, active bowel sounds in all quadrants              Fundus: firm, non-tender, U-E             Dressing: honeycomb with steri-strips c/d/i              Incision:  approximated with sutures / no erythema / no ecchymosis / no drainage  Perineum: intact; edema noted over mons pubis   Lochia: small, no clots    Extremities: +2 BLE edema, no calf pain or tenderness, SCDs on   A/P:     POD # 1 S/P [facility]            Gestational Hypertension   - BPs stable, no neural s/s   - Plan for BP check 1 week PP  ABL  Anemia compounding  chronic IDA   - Ferrous gluconate 324mg  PO daily   - Magnesium oxide 400mg  daily  A2GDM and Glucose intolerance r/t PCOS   - Continue Metformin 1000mg  PO BID   - Re-evaluate PP   Anxiety and Depression   - Stable on Lexapro    - At risk for PPD   GERD   - Continue Protonix daily             Routine postoperative care   See lactation today              May shower today  D/C IV after void x 2  Notified nursing staff to assess baby's blood sugar due to jitteriness and assess umbilical cord due to bleeding   Continue current care  Carlean Jews, MSN, CNM [facility]

## 2018-07-29 NOTE — Lactation Note (Signed)
This note was copied from a baby's chart. Lactation Consultation Note  Patient Name: Jody Crane  On arrival infant in crib. Visitors in room.  Asked mom if okay to talk about breastfeeding.  Asked visitors.  Both agreed.  Inquired about breastfeeding.  Mom reports they are working on it.  Early term infant with 5 percent weight loss 22 hours old. Urged mom to start using DEBP past feeding and feed back all EBM to infant and formula as prescribed. Mom reports she just pumped past this last breastfeeding. Mom had a few drops of colostrum she had pumped past last breastfeed.  Asked why she did not feed to infant.  Mom reports she was asleep. Mom reports nipples sore but not painful.  Urged mom to call for breastfeeding assistance.   Maternal Data    Feeding Feeding Type: Formula Length of feed: 30 min  LATCH Score Latch: Repeated attempts needed to sustain latch, nipple held in mouth throughout feeding, stimulation needed to elicit sucking reflex.  Audible Swallowing: None  Type of Nipple: Flat(short shaft nipples)  Comfort (Breast/Nipple): Soft / non-tender  Hold (Positioning): Assistance needed to correctly position infant at breast and maintain latch.  LATCH Score: 5  Interventions    Lactation Tools Discussed/Used     Consult Status      Jody Crane Crane, 11:43 AM

## 2018-07-30 ENCOUNTER — Other Ambulatory Visit: Payer: Self-pay

## 2018-07-30 MED ORDER — MAGNESIUM OXIDE 400 (241.3 MG) MG PO TABS: 400.0000 mg | ORAL_TABLET | Freq: Every day | ORAL | 1 refills | Status: AC

## 2018-07-30 MED ORDER — SIMETHICONE 80 MG PO CHEW: 80.0000 mg | CHEWABLE_TABLET | Freq: Three times a day (TID) | ORAL | 0 refills | Status: AC

## 2018-07-30 MED ORDER — IBUPROFEN 600 MG PO TABS: 600.0000 mg | ORAL_TABLET | Freq: Four times a day (QID) | ORAL | 0 refills | Status: AC

## 2018-07-30 MED ORDER — HYDROCHLOROTHIAZIDE 12.5 MG PO TABS: 12.5000 mg | ORAL_TABLET | Freq: Every day | ORAL | 0 refills | Status: DC

## 2018-07-30 NOTE — Lactation Note (Addendum)
This note was copied from a baby's chart. Lactation Consultation Note  Patient Name: Jody Crane Date: 07/30/2018   P1, Baby 47 hours old. [redacted]w[redacted]d.  6.6% weight loss.  Hx of PCOS, Breast augmentation per RN, GDM, GHTN. Less than 2% weight loss in the last 24 hours.  Voids/stools WNL.  Mother is using #20NS to latch and supplementing with breastmilk and formula after then pumping.  Discussed increasing volume per day of life and as baby desires. Discussed fenugreek after 2 weeks if low milk supply.   Mother has abrasions on tips of nipples is using shells, coconut oil and comfort gels. Mother has personal DEBP at home. Suggest OP appt.   Recommend trying without NS half way through feedings.      Maternal Data    Feeding Feeding Type: Breast Fed Length of feed: 20 min  LATCH Score                   Interventions    Lactation Tools Discussed/Used     Consult Status      Hardie Pulley 07/30/2018, 1:18 PM

## 2018-07-30 NOTE — Lactation Note (Addendum)
This note was copied from a baby's chart. Lactation Consultation Note  Patient Name: Jody Crane [id number] Date: 07/30/2018 Reason for consult: Follow-up assessment;Early term 37-38.6wks;1st time breastfeeding P1, 34 hour female infant, 5% weight loss and hx low blood sugars.  Mom hx GDM and c/s delivery. Infant asleep in basinet. Mom hand expressed 1 ml of of colostrum, infant is ETI and mom is supplementing with formula according previous LC recommendations. Infant feed less than 3 hours ago. LC unable observe latch at this time. Per mom, has sore nipples and LC notice small abrasion on right breast explained how to use comfort gels and advise mom leave EBM on nipples to help with healing slight abrasion.  Per mom, she has been using DEBP as previously advised by LC.  LC reviewed mom's plan: 1. Mom will BF 15 minutes. 2. Give back EBM that is hand expressed or pump then supplement with formula based on hours/ age since birth for ETI.  3. Mom will do hand expression and breast massages to help with breast stimulation and milk induction. 4. Mom will pump every 3 hours.   Maternal Data    Feeding Feeding Type: Breast Milk with Formula added Length of feed: 10 min  LATCH Score                   Interventions    Lactation Tools Discussed/Used     Consult Status Consult Status: Follow-up Date: 07/30/18 Follow-up type: In-patient    Danelle Earthly 07/30/2018, 12:33 AM

## 2018-07-30 NOTE — Discharge Instructions (Signed)
Home Care Instructions for Mom °ACTIVITY °· Gradually return to your regular activities. °· Let yourself rest. Nap while your baby sleeps. °· Avoid lifting anything that is heavier than 10 lb (4.5 kg) until your health care provider says it is okay. °· Avoid activities that take a lot of effort and energy (are strenuous) until approved by your health care provider. Walking at a slow-to-moderate pace is usually safe. °· If you had a cesarean delivery: °? Do not vacuum, climb stairs, or drive a car for 4-6 weeks. °? Have someone help you at home until you feel like you can do your usual activities yourself. °? Do exercises as told by your health care provider, if this applies. ° °VAGINAL BLEEDING °You may continue to bleed for 4-6 weeks after delivery. Over time, the amount of blood usually decreases and the color of the blood usually gets lighter. However, the flow of bright red blood may increase if you have been too active. If you need to use more than one pad in an hour because your pad gets soaked, or if you pass a large clot: °· Lie down. °· Raise your feet. °· Place a cold compress on your lower abdomen. °· Rest. °· Call your health care provider. ° °If you are breastfeeding, your period should return anytime between 8 weeks after delivery and the time that you stop breastfeeding. If you are not breastfeeding, your period should return 6-8 weeks after delivery. °PERINEAL CARE °The perineal area, or perineum, is the part of your body between your thighs. After delivery, this area needs special care. Follow these instructions as told by your health care provider. °· Take warm tub baths for 15-20 minutes. °· Use medicated pads and pain-relieving sprays and creams as told. °· Do not use tampons or douches until vaginal bleeding has stopped. °· Each time you go to the bathroom: °? Use a peri bottle. °? Change your pad. °? Use towelettes in place of toilet paper until your stitches have healed. °· Do Kegel exercises  every day. Kegel exercises help to maintain the muscles that support the vagina, bladder, and bowels. You can do these exercises while you are standing, sitting, or lying down. To do Kegel exercises: °? Tighten the muscles of your abdomen and the muscles that surround your birth canal. °? Hold for a few seconds. °? Relax. °? Repeat until you have done this 5 times in a row. °· To prevent hemorrhoids from developing or getting worse: °? Drink enough fluid to keep your urine clear or pale yellow. °? Avoid straining when having a bowel movement. °? Take over-the-counter medicines and stool softeners as told by your health care provider. ° °BREAST CARE °· Wear a tight-fitting bra. °· Avoid taking over-the-counter pain medicine for breast discomfort. °· Apply ice to the breasts to help with discomfort as needed: °? Put ice in a plastic bag. °? Place a towel between your skin and the bag. °? Leave the ice on for 20 minutes or as told by your health care provider. ° °NUTRITION °· Eat a well-balanced diet. °· Do not try to lose weight quickly by cutting back on calories. °· Take your prenatal vitamins until your postpartum checkup or until your health care provider tells you to stop. ° °POSTPARTUM DEPRESSION °You may find yourself crying for no apparent reason and unable to cope with all of the changes that come with having a newborn. This mood is called postpartum depression. Postpartum depression happens because your hormone   levels change after delivery. If you have postpartum depression, get support from your partner, friends, and family. If the depression does not go away on its own after several weeks, contact your health care provider. BREAST SELF-EXAM Do a breast self-exam each month, at the same time of the month. If you are breastfeeding, check your breasts just after a feeding, when your breasts are less full. If you are breastfeeding and your period has started, check your breasts on day 5, 6, or 7 of your  period. Report any lumps, bumps, or discharge to your health care provider. Know that breasts are normally lumpy if you are breastfeeding. This is temporary, and it is not a health risk. INTIMACY AND SEXUALITY Avoid sexual activity for at least 3-4 weeks after delivery or until the brownish-red vaginal flow is completely gone. If you want to avoid pregnancy, use some form of birth control. You can get pregnant after delivery, even if you have not had your period. SEEK MEDICAL CARE IF:  You feel unable to cope with the changes that a child brings to your life, and these feelings do not go away after several weeks.  You notice a lump, a bump, or discharge on your breast.  SEEK IMMEDIATE MEDICAL CARE IF:  Blood soaks your pad in 1 hour or less.  You have: ? Severe pain or cramping in your lower abdomen. ? A bad-smelling vaginal discharge. ? A fever that is not controlled by medicine. ? A fever, and an area of your breast is red and sore. ? Pain or redness in your calf. ? Sudden, severe chest pain. ? Shortness of breath. ? Painful or bloody urination. ? Problems with your vision.  You vomit for 12 hours or longer.  You develop a severe headache.  You have serious thoughts about hurting yourself, your child, or anyone else.  This information is not intended to replace advice given to you by your health care provider. Make sure you discuss any questions you have with your health care provider. Document Released: 10/19/2000 Document Revised: 03/29/2016 Document Reviewed: 04/25/2015 Elsevier Interactive Patient Education  2017 [employer]  Postpartum Care After Cesarean Delivery The period of time right after you deliver your newborn is called the postpartum period. What kind of medical care will I receive?  You may continue to receive fluids and medicines through an IV tube inserted into one of your veins.  You may have small, flexible tube (catheter) draining urine from your  bladder into a bag outside of your body. The catheter will be removed as soon as possible.  You may be given a squirt bottle to use when you go to the bathroom. You may use this until you are comfortable wiping as usual. To use the squirt bottle, follow these steps: ? Before you urinate, fill the squirt bottle with warm water. The water should be warm. Do not use hot water. ? After you urinate, while you are sitting on the toilet, use the squirt bottle to rinse the area around your urethra and vaginal opening. This rinses away any urine and blood. ? You may do this instead of wiping. As you start healing, you may use the squirt bottle before wiping yourself. Make sure to wipe gently. ? Fill the squirt bottle with clean water every time you use the bathroom.  You will be given sanitary pads to wear.  Your incision will be monitored to make sure it is healing properly. You will be told when it is  safe for your stitches, staples, or skin adhesive tape to be removed. What can I expect?  You may not feel the need to urinate for several hours after delivery.  You will have some soreness and pain in your abdomen. You may have a small amount of blood or clear fluid coming from your incision.  If you are breastfeeding, you may have uterine contractions every time you breastfeed for up to several weeks postpartum. Uterine contractions help your uterus return to its normal size.  It is normal to have vaginal bleeding (lochia) after delivery. The amount and appearance of lochia is often similar to a menstrual period in the first week after delivery. It will gradually decrease over the next few weeks to a dry, yellow-brown discharge. For most women, lochia stops completely by 6-8 weeks after delivery. Vaginal bleeding can vary from woman to woman.  Within the first few days after delivery, you may have breast engorgement. This is when your breasts feel heavy, full, and uncomfortable. Your breasts may also  throb and feel hard, tightly stretched, warm, and tender. After this occurs, you may have milk leaking from your breasts.Your health care provider can help you relieve discomfort due to breast engorgement. Breast engorgement should go away within a few days.  You may feel more sad or worried than normal due to hormonal changes after delivery. These feelings should not last more than a few days. If these feelings do not go away after several days, speak with your health care provider. How should I care for myself?  Tell your health care provider if you have pain or discomfort.  Drink enough water to keep your urine clear or pale yellow.  Wash your hands thoroughly with soap and water for at least 20 seconds after changing your sanitary pads or using the toilet, and before holding or feeding your baby.  If you are not breastfeeding, avoid touching your breasts a lot. Doing this can make your breasts produce more milk.  If you become weak or lightheaded, or you feel like you might faint, ask for help before: ? Getting out of bed. ? Showering.  Change your sanitary pads frequently. Watch for any changes in your flow, such as a sudden increase in volume, a change in color, or the passing of large blood clots. If you pass a blood clot from your vagina, save it to show to your health care provider. Do not flush blood clots down the toilet without having your health care provider look at them.  Make sure that all your vaccinations are up to date. This can help protect you and your baby from getting certain diseases. You may need to have immunizations done before you leave the hospital.  If desired, talk with your health care provider about methods of family planning or birth control (contraception). How can I start bonding with my baby? Spending as much time as possible with your baby is very important. During this time, you and your baby can get to know each other and develop a bond. Having your  baby stay with you in your room (rooming in) can give you time to get to know your baby. Rooming in can also help you become comfortable caring for your baby. Breastfeeding can also help you bond with your baby. How can I plan for returning home with my baby?  Make sure that you have a car seat installed in your vehicle. ? Your car seat should be checked by a certified car seat  installer to make sure that it is installed safely. ? Make sure that your baby fits into the car seat safely.  Ask your health care provider any questions you have about caring for yourself or your baby. Make sure that you are able to contact your health care provider with any questions after leaving the hospital. This information is not intended to replace advice given to you by your health care provider. Make sure you discuss any questions you have with your health care provider. Document Released: 07/16/2012 Document Revised: 03/26/2016 Document Reviewed: 09/26/2015 Elsevier Interactive Patient Education  2018 [employer]. Postpartum Depression and Baby Blues The postpartum period begins right after the birth of a baby. During this time, there is often a great amount of joy and excitement. It is also a time of many changes in the life of the parents. Regardless of how many times a mother gives birth, each child brings new challenges and dynamics to the family. It is not unusual to have feelings of excitement along with confusing shifts in moods, emotions, and thoughts. All mothers are at risk of developing postpartum depression or the "baby blues." These mood changes can occur right after giving birth, or they may occur many months after giving birth. The baby blues or postpartum depression can be mild or severe. Additionally, postpartum depression can go away rather quickly, or it can be a long-term condition. What are the causes? Raised hormone levels and the rapid drop in those levels are thought to be a main cause of  postpartum depression and the baby blues. A number of hormones change during and after pregnancy. Estrogen and progesterone usually decrease right after the delivery of your baby. The levels of thyroid hormone and various cortisol steroids also rapidly drop. Other factors that play a role in these mood changes include major life events and genetics. What increases the risk? If you have any of the following risks for the baby blues or postpartum depression, know what symptoms to watch out for during the postpartum period. Risk factors that may increase the likelihood of getting the baby blues or postpartum depression include:  Having a personal or family history of depression.  Having depression while being pregnant.  Having premenstrual mood issues or mood issues related to oral contraceptives.  Having a lot of life stress.  Having marital conflict.  Lacking a social support network.  Having a baby with special needs.  Having health problems, such as diabetes.  What are the signs or symptoms? Symptoms of baby blues include:  Brief changes in mood, such as going from extreme happiness to sadness.  Decreased concentration.  Difficulty sleeping.  Crying spells, tearfulness.  Irritability.  Anxiety.  Symptoms of postpartum depression typically begin within the first month after giving birth. These symptoms include:  Difficulty sleeping or excessive sleepiness.  Marked weight loss.  Agitation.  Feelings of worthlessness.  Lack of interest in activity or food.  Postpartum psychosis is a very serious condition and can be dangerous. Fortunately, it is rare. Displaying any of the following symptoms is cause for immediate medical attention. Symptoms of postpartum psychosis include:  Hallucinations and delusions.  Bizarre or disorganized behavior.  Confusion or disorientation.  How is this diagnosed? A diagnosis is made by an evaluation of your symptoms. There are no  medical or lab tests that lead to a diagnosis, but there are various questionnaires that a health care provider may use to identify those with the baby blues, postpartum depression, or psychosis. Often,  a screening tool called the [place] Postnatal Depression Scale is used to diagnose depression in the postpartum period. How is this treated? The baby blues usually goes away on its own in 1-2 weeks. Social support is often all that is needed. You will be encouraged to get adequate sleep and rest. Occasionally, you may be given medicines to help you sleep. Postpartum depression requires treatment because it can last several months or longer if it is not treated. Treatment may include individual or group therapy, medicine, or both to address any social, physiological, and psychological factors that may play a role in the depression. Regular exercise, a healthy diet, rest, and social support may also be strongly recommended. Postpartum psychosis is more serious and needs treatment right away. Hospitalization is often needed. Follow these instructions at home:  Get as much rest as you can. Nap when the baby sleeps.  Exercise regularly. Some women find yoga and walking to be beneficial.  Eat a balanced and nourishing diet.  Do little things that you enjoy. Have a cup of tea, take a bubble bath, read your favorite magazine, or listen to your favorite music.  Avoid alcohol.  Ask for help with household chores, cooking, grocery shopping, or running errands as needed. Do not try to do everything.  Talk to people close to you about how you are feeling. Get support from your partner, family members, friends, or other new moms.  Try to stay positive in how you think. Think about the things you are grateful for.  Do not spend a lot of time alone.  Only take over-the-counter or prescription medicine as directed by your health care provider.  Keep all your postpartum appointments.  Let your health  care provider know if you have any concerns. Contact a health care provider if: You are having a reaction to or problems with your medicine. Get help right away if:  You have suicidal feelings.  You think you may harm the baby or someone else. This information is not intended to replace advice given to you by your health care provider. Make sure you discuss any questions you have with your health care provider. Document Released: 07/26/2004 Document Revised: 03/29/2016 Document Reviewed: 08/03/2013 Elsevier Interactive Patient Education  2017 [employer].

## 2018-07-30 NOTE — Discharge Summary (Addendum)
Obstetric Discharge Summary  Patient ID: Jody Crane MRN: [id number] DOB/AGE: 33/07/1986 33 y.o.   Date of Admission: 07/27/2018  Date of Discharge:  07/30/18  Admitting Diagnosis: Induction of labor at [redacted]w[redacted]d  Secondary Diagnosis: Gestational hypertension, Anemia, Anxiety/Depression, Polycystic ovarian syndrome, Gestational Diabetes  Mode of Delivery: primary cesarean section- low uterine, transverse     Discharge Diagnosis: Reasons for cesarean section  Arrest of Dilation and Non-reassuring FHR   Intrapartum Procedures: Atificial rupture of membranes, epidural, pitocin augmentation and placement of intrauterine catheter   Post partum procedures: None  Complications: None   Brief Hospital Course    Jody Crane is a G1P1001 who underwent cesarean section on 07/28/2018.  Patient had an uncomplicated surgery; for further details of this surgery, please refer to the operative note.  Patient had an uncomplicated postpartum course.  By time of discharge on POD#2/PPD#2, her pain was controlled on oral pain medications; she had appropriate lochia and was ambulating, voiding without difficulty, tolerating regular diet and passing flatus.   She was deemed stable for discharge to home. She requested a short course of oral diuretics to decrease lower extremity swelling.    Labs: CBC Latest Ref Rng & Units 07/29/2018 07/28/2018 07/28/2018  WBC 4.0 - 10.5 K/uL 13.1(H) 13.8(H) 13.4(H)  Hemoglobin 12.0 - 15.0 g/dL 4.0(J) 10.5(L) 10.6(L)  Hematocrit 36.0 - 46.0 % 29.9(L) 31.2(L) 31.6(L)  Platelets 150 - 400 K/uL 176 171 186   Conflict (See Lab Report): B POS/B POS Performed at [facility], [address], [place], [place] [address]   Physical exam:   Temp:  [98 F (36.7 C)-98.6 F (37 C)] 98 F (36.7 C) (09/24 2204) Pulse Rate:  [98-101] 98 (09/24 2204) Resp:  [18] 18 (09/24 2204) BP: (120-135)/(71-89) 135/89 (09/24 2204) SpO2:  [98 %-99 %] 99 % (09/24 2204)  General: alert and  no distress  Lochia: appropriate  Abdomen: soft, NT  Uterine Fundus: firm  Incision: healing well, no significant drainage, no dehiscence, no significant erythema  Extremities: No evidence of DVT seen on physical exam; non-pitting edema present   Discharge Instructions: Per After Visit Summary  Activity: Advance as tolerated. Pelvic rest for 6 weeks.  Also refer to After Visit Summary  Diet: Regular  Medications: Allergies as of 07/30/2018      Reactions   Macrobid [nitrofurantoin Macrocrystal] Rash      Medication List    TAKE these medications   escitalopram 20 MG tablet Commonly known as:  LEXAPRO Take 20 mg by mouth daily.   FERRALET 90 90-1 MG Tabs Take 1 tablet by mouth daily.   hydrochlorothiazide 12.5 MG tablet Commonly known as:  HYDRODIURIL Take 1 tablet (12.5 mg total) by mouth daily for 5 days.   ibuprofen 600 MG tablet Commonly known as:  ADVIL,MOTRIN Take 1 tablet (600 mg total) by mouth every 6 (six) hours.   magnesium oxide 400 (241.3 Mg) MG tablet Commonly known as:  MAG-OX Take 1 tablet (400 mg total) by mouth daily. Start taking on:  07/31/2018   metFORMIN 1000 MG tablet Commonly known as:  GLUCOPHAGE Take 1,000 mg by mouth 2 (two) times daily with a meal.   omeprazole 40 MG capsule Commonly known as:  PRILOSEC Take 40 mg by mouth 2 (two) times daily.   PROAIR HFA 108 (90 Base) MCG/ACT inhaler Generic drug:  albuterol Inhale 2 puffs into the lungs as needed. wheezing   simethicone 80 MG chewable tablet Commonly known as:  MYLICON Chew 1 tablet (80  mg total) by mouth 3 (three) times daily after meals.            Discharge Care Instructions  (From admission, onward)         Start     Ordered   07/30/18 0000  Discharge wound care:    Comments:  Remove Honeycomb in five (5) to seven (7) days. Remove steri strips in two (2) weeks.   07/30/18 1145         Outpatient follow up:  Follow-up Information    Obgyn, [facility]  Follow up in 1 week(s).   Why:  Please call to schedule one (1) week blood pressure check  Contact information: [address] [place] [address] [phone]        Noland Fordyce, MD Follow up in 6 week(s).   Specialty:  Obstetrics and Gynecology Why:  The office will call you to schedule a six (6) week postpartum visit with Dr. Welton Flakes information: [address] [place] [address] [phone]          Postpartum contraception: abstinence, IUD  Discharged Condition: stable  Discharged to: home   Newborn Data:  Disposition:home with mother  Apgars: APGAR (1 MIN): 8   APGAR (5 MINS): 9      Baby Feeding: Breast   Gunnar Bulla, CNM [facility] 07/30/18 11:46 AM

## 2018-08-17 ENCOUNTER — Inpatient Hospital Stay (HOSPITAL_COMMUNITY): Admission: AD | Admit: 2018-08-17 | Payer: PRIVATE HEALTH INSURANCE | Source: Ambulatory Visit | Admitting: Obstetrics

## 2018-08-22 ENCOUNTER — Other Ambulatory Visit: Payer: Self-pay | Admitting: Certified Nurse Midwife

## 2019-03-18 ENCOUNTER — Ambulatory Visit: Payer: PRIVATE HEALTH INSURANCE | Admitting: Family Medicine

## 2019-03-31 ENCOUNTER — Ambulatory Visit: Payer: PRIVATE HEALTH INSURANCE | Admitting: Adult Health

## 2020-02-24 ENCOUNTER — Other Ambulatory Visit: Payer: Self-pay | Admitting: Family Medicine

## 2020-02-24 DIAGNOSIS — K2100 Gastro-esophageal reflux disease with esophagitis, without bleeding: Secondary | ICD-10-CM

## 2020-03-03 ENCOUNTER — Other Ambulatory Visit: Payer: PRIVATE HEALTH INSURANCE

## 2020-03-22 ENCOUNTER — Institutional Professional Consult (permissible substitution): Payer: 59 | Admitting: Neurology

## 2020-03-28 ENCOUNTER — Telehealth: Payer: Self-pay | Admitting: Neurology

## 2020-03-28 NOTE — Telephone Encounter (Signed)
On 03/21/20 pt called the day before appt stating she needed to reschedule sleep consult due to a scheduling conflict. Today pt left a voice message on the main line stating she can't keep her scheduled sleep consult appt for 03/29/20. This would be a dismissal per our no show policy. Dr. Frances Furbish do you want to dismiss the pt?

## 2020-03-28 NOTE — Telephone Encounter (Signed)
Please follow dismissal protocol as per our No Show Policy.   

## 2020-03-29 ENCOUNTER — Institutional Professional Consult (permissible substitution): Payer: 59 | Admitting: Neurology

## 2020-03-30 ENCOUNTER — Encounter: Payer: Self-pay | Admitting: Neurology

## 2020-04-25 ENCOUNTER — Other Ambulatory Visit: Payer: Self-pay | Admitting: Surgery

## 2020-05-03 ENCOUNTER — Other Ambulatory Visit: Payer: PRIVATE HEALTH INSURANCE

## 2021-04-10 ENCOUNTER — Other Ambulatory Visit: Payer: Self-pay

## 2021-04-10 ENCOUNTER — Emergency Department (HOSPITAL_BASED_OUTPATIENT_CLINIC_OR_DEPARTMENT_OTHER): Payer: 59

## 2021-04-10 ENCOUNTER — Emergency Department (HOSPITAL_BASED_OUTPATIENT_CLINIC_OR_DEPARTMENT_OTHER)
Admission: EM | Admit: 2021-04-10 | Discharge: 2021-04-10 | Disposition: A | Discharge status: Home / Self Care | Payer: 59 | Attending: Emergency Medicine | Admitting: Emergency Medicine

## 2021-04-10 ENCOUNTER — Encounter (HOSPITAL_BASED_OUTPATIENT_CLINIC_OR_DEPARTMENT_OTHER): Payer: Self-pay | Admitting: Emergency Medicine

## 2021-04-10 DIAGNOSIS — Z79899 Other long term (current) drug therapy: Secondary | ICD-10-CM | POA: Insufficient documentation

## 2021-04-10 DIAGNOSIS — Z87891 Personal history of nicotine dependence: Secondary | ICD-10-CM | POA: Insufficient documentation

## 2021-04-10 DIAGNOSIS — Z7984 Long term (current) use of oral hypoglycemic drugs: Secondary | ICD-10-CM | POA: Diagnosis not present

## 2021-04-10 DIAGNOSIS — R200 Anesthesia of skin: Secondary | ICD-10-CM | POA: Insufficient documentation

## 2021-04-10 DIAGNOSIS — E119 Type 2 diabetes mellitus without complications: Secondary | ICD-10-CM | POA: Insufficient documentation

## 2021-04-10 DIAGNOSIS — J019 Acute sinusitis, unspecified: Secondary | ICD-10-CM | POA: Diagnosis not present

## 2021-04-10 DIAGNOSIS — D72829 Elevated white blood cell count, unspecified: Secondary | ICD-10-CM | POA: Insufficient documentation

## 2021-04-10 DIAGNOSIS — J3489 Other specified disorders of nose and nasal sinuses: Secondary | ICD-10-CM | POA: Diagnosis not present

## 2021-04-10 DIAGNOSIS — R519 Headache, unspecified: Secondary | ICD-10-CM | POA: Diagnosis present

## 2021-04-10 DIAGNOSIS — R110 Nausea: Secondary | ICD-10-CM | POA: Insufficient documentation

## 2021-04-10 LAB — COMPREHENSIVE METABOLIC PANEL
ALT: 42 U/L (ref 0–44)
AST: 26 U/L (ref 15–41)
Albumin: 4.7 g/dL (ref 3.5–5.0)
Alkaline Phosphatase: 87 U/L (ref 38–126)
Anion gap: 8 (ref 5–15)
BUN: 10 mg/dL (ref 6–20)
CO2: 27 mmol/L (ref 22–32)
Calcium: 9.3 mg/dL (ref 8.9–10.3)
Chloride: 100 mmol/L (ref 98–111)
Creatinine, Ser: 0.46 mg/dL (ref 0.44–1.00)
GFR, Estimated: >60 mL/min (ref >60)
Glucose, Bld: 127 mg/dL — ABNORMAL HIGH (ref 70–99)
Potassium: 4.1 mmol/L (ref 3.5–5.1)
Sodium: 135 mmol/L (ref 135–145)
Total Bilirubin: 0.6 mg/dL (ref 0.3–1.2)
Total Protein: 8.5 g/dL — ABNORMAL HIGH (ref 6.5–8.1)

## 2021-04-10 LAB — CBC WITH DIFFERENTIAL/PLATELET
Abs Immature Granulocytes: 0.03 10*3/uL (ref 0.00–0.07)
Basophils Absolute: 0 10*3/uL (ref 0.0–0.1)
Basophils Relative: 0 %
Eosinophils Absolute: 0.1 10*3/uL (ref 0.0–0.5)
Eosinophils Relative: 1 %
HCT: 44.5 % (ref 36.0–46.0)
Hemoglobin: 14.8 g/dL (ref 12.0–15.0)
Immature Granulocytes: 0 %
Lymphocytes Relative: 19 %
Lymphs Abs: 2.3 10*3/uL (ref 0.7–4.0)
MCH: 29.2 pg (ref 26.0–34.0)
MCHC: 33.3 g/dL (ref 30.0–36.0)
MCV: 87.9 fL (ref 80.0–100.0)
Monocytes Absolute: 0.8 10*3/uL (ref 0.1–1.0)
Monocytes Relative: 7 %
Neutro Abs: 8.9 10*3/uL — ABNORMAL HIGH (ref 1.7–7.7)
Neutrophils Relative %: 73 %
Platelets: 273 10*3/uL (ref 150–400)
RBC: 5.06 MIL/uL (ref 3.87–5.11)
RDW: 13.5 % (ref 11.5–15.5)
WBC: 12.2 10*3/uL — ABNORMAL HIGH (ref 4.0–10.5)
nRBC: 0 % (ref 0.0–0.2)

## 2021-04-10 LAB — PREGNANCY, URINE: Preg Test, Ur: NEGATIVE

## 2021-04-10 MED ORDER — SODIUM CHLORIDE 0.9 % IV BOLUS
1000.0000 mL | Freq: Once | INTRAVENOUS | Status: AC
  Administered 2021-04-10: 1000 mL via INTRAVENOUS

## 2021-04-10 MED ORDER — FLUTICASONE PROPIONATE 50 MCG/ACT NA SUSP: 2.0000 | Freq: Every day | NASAL | 2 refills | Status: AC

## 2021-04-10 MED ORDER — PROCHLORPERAZINE EDISYLATE 10 MG/2ML IJ SOLN
10.0000 mg | Freq: Once | INTRAMUSCULAR | Status: AC
  Administered 2021-04-10: 10 mg via INTRAVENOUS
  Filled 2021-04-10: qty 2

## 2021-04-10 MED ORDER — DIPHENHYDRAMINE HCL 50 MG/ML IJ SOLN
25.0000 mg | Freq: Once | INTRAMUSCULAR | Status: AC
  Administered 2021-04-10: 25 mg via INTRAVENOUS
  Filled 2021-04-10: qty 1

## 2021-04-10 MED ORDER — AMOXICILLIN-POT CLAVULANATE 875-125 MG PO TABS: 1.0000 | ORAL_TABLET | Freq: Two times a day (BID) | ORAL | 0 refills | Status: DC

## 2021-04-10 MED ORDER — KETOROLAC TROMETHAMINE 30 MG/ML IJ SOLN
30.0000 mg | Freq: Once | INTRAMUSCULAR | Status: AC
  Administered 2021-04-10: 30 mg via INTRAVENOUS
  Filled 2021-04-10: qty 1

## 2021-04-10 NOTE — ED Triage Notes (Signed)
Left sided facial pain and numbness.  Pt has had sinus headache since saturday but this am had sharp pain to left temple with some associated numbness to left cheek and temple.  No facial droop, no weakness, no dizziness.  Some bilateral haziness with vision.

## 2021-04-10 NOTE — ED Provider Notes (Signed)
[facility] EMERGENCY DEPARTMENT Provider Note   CSN: [id number] Arrival date & time: 04/10/21  1211     History Chief Complaint  Patient presents with  . Facial Pain    Jody Crane is a 35 y.o. female with PMH/o GERD, DM, Anxiety and Depression who presents for evaluation of left-sided headache, facial pain, numbness.  Reports that about 2 or 3 days ago, she first felt like she had some sinus pressure, facial pain on the left side.  She has had some rhinorrhea, nasal congestion.  She reports that yesterday, when she was laying in bed, she started developing mild headache on the left side.  That has since progressed and got worse.  She states that it is only on the left side.  She reports that today while at work, the pain got worse and she had some "haziness to bilateral eyes."  She states she also feels a tingling sensation noted to the left cheek area.  No weakness.  She did not fall or hit her head.  She is taken over-the-counter medications with no improvement.  No history of migraines.  She took a COVID test that was negative.  She has had some nausea but no vomiting.  She denies any fevers, chest pain, difficulty breathing, abdominal pain, numbness/weakness of her arms or legs.No OCPs, no recent travel or surgery.   The history is provided by the patient.       Past Medical History:  Diagnosis Date  . Anxiety   . Chronic cystitis   . Depression   . Elevated blood pressure reading without diagnosis of hypertension   . GERD (gastroesophageal reflux disease)   . Gestational diabetes    gestational  . PCOS (polycystic ovarian syndrome)     Patient Active Problem List   Diagnosis Date Noted  . S/P cesarean section: indication: fetal tachycardia and arrest of dilation  07/28/2018  . Gestational hypertension 07/28/2018  . Postpartum care following cesarean delivery (9/23) 07/28/2018  . GDM, class A2 07/28/2018  . Encounter for planned induction of labor 07/27/2018     Past Surgical History:  Procedure Laterality Date  . BREAST ENHANCEMENT SURGERY    . CESAREAN SECTION N/A 07/28/2018   Procedure: CESAREAN SECTION;  Surgeon: Noland FordyceFogleman, Kelly, MD;  Location: [facility];  Service: Obstetrics;  Laterality: N/A;  . CHOLECYSTECTOMY       OB History    Gravida  1   Para  1   Term  1   Preterm      AB      Living  1     SAB      IAB      Ectopic      Multiple  0   Live Births  1           Family History  Problem Relation Age of Onset  . Celiac disease Mother   . Diabetes Father   . Hyperlipidemia Father   . Glaucoma Maternal Grandmother   . Hypertension Maternal Grandmother   . Kidney disease Maternal Grandmother   . Thyroid disease Maternal Grandmother   . Aneurysm Maternal Grandmother   . Aneurysm Maternal Grandfather   . Cataracts Maternal Grandfather   . Glaucoma Maternal Grandfather   . Hypertension Maternal Grandfather   . Diabetes Paternal Grandfather     Social History   Tobacco Use  . Smoking status: Former Smoker    Packs/day: 1.00    Quit date: 07/24/2015  Years since quitting: 5.7  . Smokeless tobacco: Never Used  Vaping Use  . Vaping Use: Never used  Substance Use Topics  . Alcohol use: No  . Drug use: No    Home Medications Prior to Admission medications   Medication Sig Start Date End Date Taking? Authorizing Provider  amoxicillin-clavulanate (AUGMENTIN) 875-125 MG tablet Take 1 tablet by mouth every 12 (twelve) hours. 04/10/21  Yes Maxwell Caul, PA-C  fluticasone (FLONASE) 50 MCG/ACT nasal spray Place 2 sprays into both nostrils daily for 5 days. 04/10/21 04/15/21 Yes Maxwell Caul, PA-C  albuterol (PROAIR HFA) 108 (90 Base) MCG/ACT inhaler Inhale 2 puffs into the lungs as needed. wheezing 06/14/15   [provider]  buPROPion (WELLBUTRIN XL) 300 MG 24 hr tablet Take 300 mg by mouth daily.    [provider]  escitalopram (LEXAPRO) 20 MG tablet Take 20 mg by mouth  daily.     [provider]  Fe Cbn-Fe Gluc-FA-B12-C-DSS (FERRALET 90) 90-1 MG TABS Take 1 tablet by mouth daily. 06/26/18   [provider]  hydrochlorothiazide (HYDRODIURIL) 12.5 MG tablet Take 1 tablet (12.5 mg total) by mouth daily for 5 days. 07/30/18 08/04/18  Gunnar Bulla, CNM  ibuprofen (ADVIL,MOTRIN) 600 MG tablet Take 1 tablet (600 mg total) by mouth every 6 (six) hours. 07/30/18   Lawhorn, Vanessa [place], CNM  Levonorgestrel (LILETTA, 52 MG, IU) by Intrauterine route.    [provider]  magnesium oxide (MAG-OX) 400 (241.3 Mg) MG tablet Take 1 tablet (400 mg total) by mouth daily. 07/31/18   Lawhorn, Vanessa [place], CNM  metFORMIN (GLUCOPHAGE) 1000 MG tablet Take 1,000 mg by mouth 2 (two) times daily with a meal.    [provider]  omeprazole (PRILOSEC) 40 MG capsule Take 40 mg by mouth 2 (two) times daily.    [provider]  simethicone (MYLICON) 80 MG chewable tablet Chew 1 tablet (80 mg total) by mouth 3 (three) times daily after meals. 07/30/18   Lawhorn, Vanessa [place], CNM    Allergies    Macrobid [nitrofurantoin macrocrystal]  Review of Systems   Review of Systems  Constitutional: Negative for fever.  HENT: Positive for congestion and rhinorrhea.   Respiratory: Negative for cough and shortness of breath.   Cardiovascular: Negative for chest pain.  Gastrointestinal: Positive for nausea. Negative for abdominal pain and vomiting.  Genitourinary: Negative for dysuria and hematuria.  Neurological: Positive for numbness and headaches. Negative for facial asymmetry, speech difficulty and weakness.  All other systems reviewed and are negative.   Physical Exam Updated Vital Signs BP 132/77   Pulse (!) 108   Temp 98.2 F (36.8 C) (Oral)   Resp 20   Ht 5\' 6"  (1.676 m)   Wt 124.7 kg   SpO2 99%   BMI 44.39 kg/m   Physical Exam Vitals and nursing note reviewed.  Constitutional:      Appearance: Normal appearance.  She is well-developed.  HENT:     Head: Normocephalic and atraumatic.     Nose: Congestion present.  Eyes:     General: Lids are normal.     Conjunctiva/sclera: Conjunctivae normal.     Pupils: Pupils are equal, round, and reactive to light.     Comments: PERRL. EOMs intact. No nystagmus. No neglect.   Neck:     Comments: Neck is supple and without rigidity.  Cardiovascular:     Rate and Rhythm: Normal rate and regular rhythm.     Pulses: Normal  pulses.     Heart sounds: Normal heart sounds. No murmur heard. No friction rub. No gallop.   Pulmonary:     Effort: Pulmonary effort is normal.     Breath sounds: Normal breath sounds.     Comments: Lungs clear to auscultation bilaterally.  Symmetric chest rise.  No wheezing, rales, rhonchi. Abdominal:     Palpations: Abdomen is soft. Abdomen is not rigid.     Tenderness: There is no abdominal tenderness. There is no guarding.     Comments: Abdomen is soft, non-distended, non-tender. No rigidity, No guarding. No peritoneal signs.  Musculoskeletal:        General: Normal range of motion.     Cervical back: Full passive range of motion without pain.  Skin:    General: Skin is warm and dry.     Capillary Refill: Capillary refill takes less than 2 seconds.  Neurological:     Mental Status: She is alert and oriented to person, place, and time.     Comments: Cranial nerves III-XII intact Follows commands, Moves all extremities  5/5 strength to BUE and BLE  Reports decreased sensation noted in the V2 distribution on the left side.  Sensation intact throughout all major nerve distributions Normal finger to nose. No dysdiadochokinesia. No pronator drift. No gait abnormalities  No slurred speech. No facial droop.   Psychiatric:        Speech: Speech normal.     ED Results / Procedures / Treatments   Labs (all labs ordered are listed, but only abnormal results are displayed) Labs Reviewed  COMPREHENSIVE METABOLIC PANEL - Abnormal;  Notable for the following components:      Result Value   Glucose, Bld 127 (*)    Total Protein 8.5 (*)    All other components within normal limits  CBC WITH DIFFERENTIAL/PLATELET - Abnormal; Notable for the following components:   WBC 12.2 (*)    Neutro Abs 8.9 (*)    All other components within normal limits  PREGNANCY, URINE    EKG None  Radiology CT Head Wo Contrast  Result Date: 04/10/2021 CLINICAL DATA:  Headache, slurred speech. EXAM: CT HEAD WITHOUT CONTRAST TECHNIQUE: Contiguous axial images were obtained from the base of the skull through the vertex without intravenous contrast. COMPARISON:  None. FINDINGS: Brain: No evidence of acute infarction, hemorrhage, hydrocephalus, extra-axial collection or mass lesion/mass effect. Vascular: No hyperdense vessel or unexpected calcification. Skull: Normal. Negative for fracture or focal lesion. Sinuses/Orbits: Bilateral ethmoid and maxillary sinusitis is noted. Other: None. IMPRESSION: Bilateral ethmoid and maxillary sinusitis. No acute intracranial abnormality seen. Electronically Signed   By: Lupita Raider M.D.   On: 04/10/2021 14:19    Procedures Procedures   Medications Ordered in ED Medications  sodium chloride 0.9 % bolus 1,000 mL (0 mLs Intravenous Stopped 04/10/21 1542)  prochlorperazine (COMPAZINE) injection 10 mg (10 mg Intravenous Given 04/10/21 1427)  diphenhydrAMINE (BENADRYL) injection 25 mg (25 mg Intravenous Given 04/10/21 1427)  ketorolac (TORADOL) 30 MG/ML injection 30 mg (30 mg Intravenous Given 04/10/21 1541)    ED Course  I have reviewed the triage vital signs and the nursing notes.  Pertinent labs & imaging results that were available during my care of the patient were reviewed by me and considered in my medical decision making (see chart for details).    MDM Rules/Calculators/A&P  35 year old female who presents for evaluation of headache that has been ongoing since yesterday.  She  reports that previously she had had some nasal congestion, rhinorrhea.  She denies any fevers.  She has had some nausea but no vomiting.  She reports the left side of her face is tingling.  On initial arrival, she is afebrile, toxic appearing.  Vital signs are stable.  On exam, she has no neurodeficits.  She does report that she feels a tingling sensation noted to the V2 distribution on the left side but does report she can feel me touching her.  No other numbness/weakness.  Neuro exam otherwise unremarkable.  Consider complex migraine versus electrolyte abnormality.  Since she does not have history of migraines, we will plan a CT head for further evaluation.  History/physical exam not concerning for meningitis, CVA, dural venous thrombosis.  Will give migraine cocktail, check labs.  CBC shows slight leukocytosis of 12.2.  Pregnancy negative.  CMP shows normal BUN and creatinine.  Patient with bilateral ethmoid and maxillary sinusitis on CT head. No other acute abnormalities.   Discussed results with patient.  Patient reports feeling much better.  She is sitting up and appears much more comfortable.  She states she still has slight headache.  The tingling sensation on her face is completely dissipated.  We will plan to give additional analgesics and reassess.  Reevaluation.  Patient reports feeling better.  I personally ambulated patient in the department without any signs of ataxia.  Patient states she feels better and the tingling sensation to the V3 distribution is completely gone.  We will plan to treat her for sinusitis given that she is symptomatic and has CT findings. At this time, patient exhibits no emergent life-threatening condition that require further evaluation in ED. Patient had ample opportunity for questions and discussion. All patient's questions were answered with full understanding. Strict return precautions discussed. Patient expresses understanding and agreement to plan.   Portions of  this note were generated with Scientist, clinical (histocompatibility and immunogenetics). Dictation errors may occur despite best attempts at proofreading.  Final Clinical Impression(s) / ED Diagnoses Final diagnoses:  Acute sinusitis, recurrence not specified, unspecified location  Acute nonintractable headache, unspecified headache type    Rx / DC Orders ED Discharge Orders         Ordered    amoxicillin-clavulanate (AUGMENTIN) 875-125 MG tablet  Every 12 hours        04/10/21 1621    fluticasone (FLONASE) 50 MCG/ACT nasal spray  Daily        04/10/21 1621           Maxwell Caul, PA-C 04/10/21 1731    Tilden Fossa, MD 04/13/21 1448

## 2021-10-25 ENCOUNTER — Encounter: Payer: Self-pay | Admitting: Gastroenterology

## 2021-10-25 NOTE — H&P (Signed)
Pre-Procedure H&P   Patient ID: Jody Crane is a 35 y.o. female.  Gastroenterology Provider: Jaynie Collins, DO  Referring Provider: Tawni Pummel, PA PCP: Lauro Regulus, MD  Date: 10/26/2021  HPI Jody Crane is a 35 y.o. female who presents today for Esophagogastroduodenoscopy for longstanding acid reflux and loose stools-evaluation for Barrett's esophagus and celiac disease.  Patient has 2 loose stools per day and frequent nausea that has become worse with starting Rybelsus.  Nausea is persistent even without eating. Mother with history of celiac disease and Barrett's esophagus.  The patient denies any dysphagia or odynophagia.  She is status postcholecystectomy.  Her hemoglobin is 16.2 MCV 85.  Currently takes omeprazole 40 mg twice daily for many years with control of her GERD, but if she misses any doses she has rebound symptoms.  Planning for duodenal switch /bariatric surgery  No other acute GI complaints  Past Medical History:  Diagnosis Date   Anxiety    Chronic cystitis    Depression    Elevated blood pressure reading without diagnosis of hypertension    GERD (gastroesophageal reflux disease)    Gestational diabetes    gestational   PCOS (polycystic ovarian syndrome)     Past Surgical History:  Procedure Laterality Date   BREAST ENHANCEMENT SURGERY     CESAREAN SECTION N/A 07/28/2018   Procedure: CESAREAN SECTION;  Surgeon: Noland Fordyce, MD;  Location: [facility];  Service: Obstetrics;  Laterality: N/A;   CHOLECYSTECTOMY      Family History Mother- celiac disease and barretts esophagus No other h/o GI disease or malignancy  Review of Systems  Constitutional:  Negative for activity change, appetite change, chills, diaphoresis, fatigue, fever and unexpected weight change.  HENT:  Negative for trouble swallowing and voice change.   Respiratory:  Negative for shortness of breath and wheezing.   Cardiovascular:  Negative for  chest pain, palpitations and leg swelling.  Gastrointestinal:  Positive for diarrhea and nausea. Negative for abdominal distention, abdominal pain, anal bleeding, blood in stool, constipation, rectal pain and vomiting.       Gerd  Musculoskeletal:  Negative for arthralgias and myalgias.  Skin:  Negative for color change and pallor.  Neurological:  Negative for dizziness, syncope and weakness.  Psychiatric/Behavioral:  Negative for confusion.   All other systems reviewed and are negative.   Medications No current facility-administered medications on file prior to encounter.   Current Outpatient Medications on File Prior to Encounter  Medication Sig Dispense Refill   albuterol (VENTOLIN HFA) 108 (90 Base) MCG/ACT inhaler Inhale 2 puffs into the lungs as needed. wheezing     amphetamine-dextroamphetamine (ADDERALL) 10 MG tablet Take 10 mg by mouth daily as needed.     buPROPion (WELLBUTRIN XL) 300 MG 24 hr tablet Take 300 mg by mouth daily.     clindamycin (CLINDAGEL) 1 % gel Apply 1 application topically 2 (two) times daily.     clonazePAM (KLONOPIN) 1 MG tablet Take 1 mg by mouth at bedtime.     escitalopram (LEXAPRO) 20 MG tablet Take 20 mg by mouth daily.      ibuprofen (ADVIL,MOTRIN) 600 MG tablet Take 1 tablet (600 mg total) by mouth every 6 (six) hours. 30 tablet 0   lisdexamfetamine (VYVANSE) 70 MG capsule Take 70 mg by mouth daily.     nystatin (MYCOSTATIN/NYSTOP) powder Apply 1 application topically 2 (two) times daily.     omeprazole (PRILOSEC) 40 MG capsule Take 40 mg by mouth 2 (  two) times daily.     promethazine-dextromethorphan (PROMETHAZINE-DM) 6.25-15 MG/5ML syrup Take 5 mLs by mouth 4 (four) times daily as needed for cough.     rosuvastatin (CRESTOR) 20 MG tablet Take 20 mg by mouth daily.     Semaglutide, 1 MG/DOSE, (OZEMPIC, 1 MG/DOSE,) 2 MG/1.5ML SOPN Inject 0.25 mg into the skin once a week.     amoxicillin-clavulanate (AUGMENTIN) 875-125 MG tablet Take 1 tablet by mouth  every 12 (twelve) hours. (Patient not taking: Reported on 10/25/2021) 14 tablet 0   Fe Cbn-Fe Gluc-FA-B12-C-DSS (FERRALET 90) 90-1 MG TABS Take 1 tablet by mouth daily. (Patient not taking: Reported on 10/25/2021)  3   fluticasone (FLONASE) 50 MCG/ACT nasal spray Place 2 sprays into both nostrils daily for 5 days. 11.1 mL 2   Levonorgestrel (LILETTA, 52 MG, IU) by Intrauterine route.     magnesium oxide (MAG-OX) 400 (241.3 Mg) MG tablet Take 1 tablet (400 mg total) by mouth daily. (Patient not taking: Reported on 10/25/2021) 30 tablet 1   meloxicam (MOBIC) 15 MG tablet Take 15 mg by mouth daily as needed for pain. (Patient not taking: Reported on 10/26/2021)     metFORMIN (GLUCOPHAGE) 1000 MG tablet Take 1,000 mg by mouth 2 (two) times daily with a meal. (Patient not taking: Reported on 10/26/2021)     propranolol (INDERAL) 40 MG tablet Take 40 mg by mouth 2 (two) times daily. (Patient not taking: Reported on 10/26/2021)     simethicone (MYLICON) 80 MG chewable tablet Chew 1 tablet (80 mg total) by mouth 3 (three) times daily after meals. 30 tablet 0    Pertinent medications related to GI and procedure were reviewed by me with the patient prior to the procedure   Current Facility-Administered Medications:    0.9 %  sodium chloride infusion, , Intravenous, Continuous, Jaynie Collins, DO, Last Rate: 20 mL/hr at 10/26/21 0841, New Bag at 10/26/21 0841      Allergies  Allergen Reactions   Macrobid [Nitrofurantoin Macrocrystal] Hives and Rash   Allergies were reviewed by me prior to the procedure  Objective    Vitals:   10/26/21 0900  BP: 137/83  Pulse: 98  Resp: 16  Temp: (!) 97.2 F (36.2 C)  SpO2: 99%  Weight: 127 kg  Height: 5\' 6"  (1.676 m)     Physical Exam Vitals and nursing note reviewed.  Constitutional:      General: She is not in acute distress.    Appearance: Normal appearance. She is not ill-appearing, toxic-appearing or diaphoretic.  HENT:     Head:  Normocephalic and atraumatic.     Nose: Nose normal.     Mouth/Throat:     Mouth: Mucous membranes are moist.     Pharynx: Oropharynx is clear.  Eyes:     General: No scleral icterus.    Extraocular Movements: Extraocular movements intact.  Cardiovascular:     Rate and Rhythm: Normal rate and regular rhythm.     Heart sounds: Normal heart sounds. No murmur heard.   No friction rub. No gallop.  Pulmonary:     Effort: Pulmonary effort is normal. No respiratory distress.     Breath sounds: Normal breath sounds. No wheezing, rhonchi or rales.  Abdominal:     General: Bowel sounds are normal. There is no distension.     Palpations: Abdomen is soft.     Tenderness: There is no abdominal tenderness. There is no guarding or rebound.  Musculoskeletal:     Cervical  back: Neck supple.     Right lower leg: No edema.     Left lower leg: No edema.  Skin:    General: Skin is warm and dry.     Coloration: Skin is not jaundiced or pale.  Neurological:     General: No focal deficit present.     Mental Status: She is alert and oriented to person, place, and time. Mental status is at baseline.  Psychiatric:        Mood and Affect: Mood normal.        Behavior: Behavior normal.        Thought Content: Thought content normal.        Judgment: Judgment normal.     Assessment:  Ms. Jody Crane is a 35 y.o. female  who presents today for Esophagogastroduodenoscopy for Barrett's esophagus and celiac disease.  Plan:  Esophagogastroduodenoscopy with possible intervention today  Esophagogastroduodenoscopy with possible biopsy, control of bleeding, polypectomy, and interventions as necessary has been discussed with the patient/patient representative. Informed consent was obtained from the patient/patient representative after explaining the indication, nature, and risks of the procedure including but not limited to death, bleeding, perforation, missed neoplasm/lesions, cardiorespiratory compromise, and  reaction to medications. Opportunity for questions was given and appropriate answers were provided. Patient/patient representative has verbalized understanding is amenable to undergoing the procedure.   Jaynie Collins, DO  [facility]  Portions of the record may have been created with voice recognition software. Occasional wrong-word or 'sound-a-like' substitutions may have occurred due to the inherent limitations of voice recognition software.  Read the chart carefully and recognize, using context, where substitutions may have occurred.

## 2021-10-26 ENCOUNTER — Encounter
Admission: RE | Disposition: A | Discharge status: Home / Self Care | Payer: Self-pay | Source: Home / Self Care | Attending: Gastroenterology

## 2021-10-26 ENCOUNTER — Ambulatory Visit: Payer: BC Managed Care – PPO | Admitting: Anesthesiology

## 2021-10-26 ENCOUNTER — Encounter: Payer: Self-pay | Admitting: Gastroenterology

## 2021-10-26 ENCOUNTER — Ambulatory Visit
Admission: RE | Admit: 2021-10-26 | Discharge: 2021-10-26 | Disposition: A | Discharge status: Home / Self Care | Payer: BC Managed Care – PPO | Attending: Gastroenterology | Admitting: Gastroenterology

## 2021-10-26 DIAGNOSIS — Z8379 Family history of other diseases of the digestive system: Secondary | ICD-10-CM | POA: Insufficient documentation

## 2021-10-26 DIAGNOSIS — Z79899 Other long term (current) drug therapy: Secondary | ICD-10-CM | POA: Insufficient documentation

## 2021-10-26 DIAGNOSIS — K2980 Duodenitis without bleeding: Secondary | ICD-10-CM | POA: Insufficient documentation

## 2021-10-26 DIAGNOSIS — K2950 Unspecified chronic gastritis without bleeding: Secondary | ICD-10-CM | POA: Insufficient documentation

## 2021-10-26 DIAGNOSIS — K219 Gastro-esophageal reflux disease without esophagitis: Secondary | ICD-10-CM | POA: Diagnosis not present

## 2021-10-26 DIAGNOSIS — R110 Nausea: Secondary | ICD-10-CM | POA: Diagnosis present

## 2021-10-26 HISTORY — PX: ESOPHAGOGASTRODUODENOSCOPY (EGD) WITH PROPOFOL: SHX5813

## 2021-10-26 LAB — POCT PREGNANCY, URINE: Preg Test, Ur: NEGATIVE

## 2021-10-26 LAB — GLUCOSE, CAPILLARY: Glucose-Capillary: 113 mg/dL — ABNORMAL HIGH (ref 70–99)

## 2021-10-26 SURGERY — ESOPHAGOGASTRODUODENOSCOPY (EGD) WITH PROPOFOL
Anesthesia: General

## 2021-10-26 MED ORDER — PROPOFOL 500 MG/50ML IV EMUL
INTRAVENOUS | Status: DC | PRN
  Administered 2021-10-26: 200 ug/kg/min via INTRAVENOUS

## 2021-10-26 MED ORDER — SODIUM CHLORIDE 0.9 % IV SOLN: INTRAVENOUS | Status: DC

## 2021-10-26 MED ORDER — PROPOFOL 10 MG/ML IV BOLUS
INTRAVENOUS | Status: DC | PRN
  Administered 2021-10-26: 100 mg via INTRAVENOUS
  Administered 2021-10-26: 30 mg via INTRAVENOUS

## 2021-10-26 NOTE — Interval H&P Note (Signed)
History and Physical Interval Note: Preprocedure H&P from 10/26/21  was reviewed and there was no interval change after seeing and examining the patient.  Written consent was obtained from the patient after discussion of risks, benefits, and alternatives. Patient has consented to proceed with Esophagogastroduodenoscopy with possible intervention   10/26/2021 9:08 AM  Jody Crane  has presented today for surgery, with the diagnosis of GERD.  The various methods of treatment have been discussed with the patient and family. After consideration of risks, benefits and other options for treatment, the patient has consented to  Procedure(s) with comments: ESOPHAGOGASTRODUODENOSCOPY (EGD) WITH PROPOFOL (N/A) - DM as a surgical intervention.  The patient's history has been reviewed, patient examined, no change in status, stable for surgery.  I have reviewed the patient's chart and labs.  Questions were answered to the patient's satisfaction.     Jaynie Collins

## 2021-10-26 NOTE — Transfer of Care (Signed)
Immediate Anesthesia Transfer of Care Note  Patient: Jody Crane  Procedure(s) Performed: ESOPHAGOGASTRODUODENOSCOPY (EGD) WITH PROPOFOL  Patient Location: PACU  Anesthesia Type:General  Level of Consciousness: awake, alert  and oriented  Airway & Oxygen Therapy: Patient Spontanous Breathing  Post-op Assessment: Report given to RN and Post -op Vital signs reviewed and stable  Post vital signs: Reviewed and stable  Last Vitals:  Vitals Value Taken Time  BP 125/68 10/26/21 0933  Temp    Pulse 106 10/26/21 0935  Resp 21 10/26/21 0935  SpO2 95 % 10/26/21 0935  Vitals shown include unvalidated device data.  Last Pain:  Vitals:   10/26/21 0934  PainSc: 0-No pain         Complications: No notable events documented.

## 2021-10-26 NOTE — Anesthesia Postprocedure Evaluation (Signed)
Anesthesia Post Note  Patient: Jody Crane  Procedure(s) Performed: ESOPHAGOGASTRODUODENOSCOPY (EGD) WITH PROPOFOL  Patient location during evaluation: PACU Anesthesia Type: General Level of consciousness: awake Pain management: pain level controlled Vital Signs Assessment: post-procedure vital signs reviewed and stable Respiratory status: spontaneous breathing and nonlabored ventilation Cardiovascular status: stable Anesthetic complications: no   No notable events documented.   Last Vitals:  Vitals:   10/26/21 0933 10/26/21 0938  BP: 125/68   Pulse:    Resp:    Temp:  (!) 36.2 C  SpO2:      Last Pain:  Vitals:   10/26/21 0938  TempSrc: Temporal  PainSc:                  VAN STAVEREN,Briton Sellman

## 2021-10-26 NOTE — Anesthesia Preprocedure Evaluation (Signed)
Anesthesia Evaluation  Patient identified by MRN, date of birth, ID band Patient awake    Reviewed: Allergy & Precautions, NPO status , Patient's Chart, lab work & pertinent test results  Airway Mallampati: II  TM Distance: >3 FB Neck ROM: full    Dental  (+) Teeth Intact   Pulmonary neg pulmonary ROS, asthma , former smoker,    Pulmonary exam normal  + decreased breath sounds      Cardiovascular Exercise Tolerance: Good hypertension, Pt. on medications negative cardio ROS Normal cardiovascular exam Rhythm:Regular Rate:Normal     Neuro/Psych Anxiety Depression negative neurological ROS  negative psych ROS   GI/Hepatic negative GI ROS, Neg liver ROS, GERD  ,  Endo/Other  negative endocrine ROSdiabetes, Well Controlled, Type 2, Oral Hypoglycemic Agents  Renal/GU negative Renal ROS  negative genitourinary   Musculoskeletal negative musculoskeletal ROS (+)   Abdominal (+) + obese,   Peds negative pediatric ROS (+)  Hematology negative hematology ROS (+)   Anesthesia Other Findings Past Medical History: No date: Anxiety No date: Chronic cystitis No date: Depression No date: Elevated blood pressure reading without diagnosis of  hypertension No date: GERD (gastroesophageal reflux disease) No date: Gestational diabetes     Comment:  gestational No date: PCOS (polycystic ovarian syndrome)  Past Surgical History: No date: BREAST ENHANCEMENT SURGERY 07/28/2018: CESAREAN SECTION; N/A     Comment:  Procedure: CESAREAN SECTION;  Surgeon: Noland Fordyce,               MD;  Location: [facility]IRTHING SUIT[facility];  Service: Obstetrics;               Laterality: N/A; No date: CHOLECYSTECTOMY     Reproductive/Obstetrics negative OB ROS                             Anesthesia Physical Anesthesia Plan  ASA: 3  Anesthesia Plan: General   Post-op Pain Management:    Induction: Intravenous  PONV  Risk Score and Plan: Propofol infusion and TIVA  Airway Management Planned: Natural Airway and Nasal Cannula  Additional Equipment:   Intra-op Plan:   Post-operative Plan:   Informed Consent: I have reviewed the patients History and Physical, chart, labs and discussed the procedure including the risks, benefits and alternatives for the proposed anesthesia with the patient or authorized representative who has indicated his/her understanding and acceptance.     Dental Advisory Given  Plan Discussed with: CRNA and Surgeon  Anesthesia Plan Comments:         Anesthesia Quick Evaluation

## 2021-10-26 NOTE — Op Note (Signed)
[facility] Patient Name: Jody Crane Procedure Date: 10/26/2021 9:06 AM MRN: [id number] Account #: [id number] Date of Birth: 1986-05-09 Admit Type: Outpatient Age: 35 Room: [facility] ENDO ROOM 1 Gender: Female Note Status: Finalized Instrument Name: Upper Endoscope [id number] Procedure:             Upper GI endoscopy Indications:           Nausea Providers:             Rueben Bash, DO Referring MD:          Ocie Cornfield. Ouida Sills MD, MD (Referring MD) Medicines:             Monitored Anesthesia Care Complications:         No immediate complications. Estimated blood loss:                         Minimal. Procedure:             Pre-Anesthesia Assessment:                        - Prior to the procedure, a History and Physical was                         performed, and patient medications and allergies were                         reviewed. The patient is competent. The risks and                         benefits of the procedure and the sedation options and                         risks were discussed with the patient. All questions                         were answered and informed consent was obtained.                         Patient identification and proposed procedure were                         verified by the physician, the nurse, the anesthetist                         and the technician in the endoscopy suite. Mental                         Status Examination: alert and oriented. Airway                         Examination: normal oropharyngeal airway and neck                         mobility. Respiratory Examination: clear to                         auscultation. CV Examination: RRR, no murmurs, no S3  or S4. Prophylactic Antibiotics: The patient does not                         require prophylactic antibiotics. Prior                         Anticoagulants: The patient has taken no previous                          anticoagulant or antiplatelet agents. ASA Grade                         Assessment: III - A patient with severe systemic                         disease. After reviewing the risks and benefits, the                         patient was deemed in satisfactory condition to                         undergo the procedure. The anesthesia plan was to use                         monitored anesthesia care (MAC). Immediately prior to                         administration of medications, the patient was                         re-assessed for adequacy to receive sedatives. The                         heart rate, respiratory rate, oxygen saturations,                         blood pressure, adequacy of pulmonary ventilation, and                         response to care were monitored throughout the                         procedure. The physical status of the patient was                         re-assessed after the procedure.                        After obtaining informed consent, the endoscope was                         passed under direct vision. Throughout the procedure,                         the patient's blood pressure, pulse, and oxygen                         saturations were monitored continuously. The Endoscope  was introduced through the mouth, and advanced to the                         second part of duodenum. The upper GI endoscopy was                         accomplished without difficulty. The patient tolerated                         the procedure poorly due to the patient's respiratory                         instability. Procedure was then stopped Findings:      Food (residue) was found in the duodenal bulb. Estimated blood loss:       none.      The duodenal bulb, first portion of the duodenum and second portion of       the duodenum were normal. Biopsies for histology were taken with a cold       forceps for evaluation of celiac disease. Estimated blood  loss was       minimal.      A medium amount of food (residue) was found on the greater curvature of       the stomach and at the pylorus. Estimated blood loss: none.      Normal mucosa was found in the entire examined stomach. Biopsies were       taken with a cold forceps for Helicobacter pylori testing. Estimated       blood loss was minimal. Only cold forep biopsies from antrum, as patient       could not tolerate procedure to acquire upper stomach bx      Esophagogastric landmarks were identified: the gastroesophageal junction       was found at 36 cm from the incisors.      The Z-line was regular. Estimated blood loss: none.      Normal mucosa was found in the entire esophagus. Impression:            - Retained food in the duodenum.                        - Normal duodenal bulb, first portion of the duodenum                         and second portion of the duodenum. Biopsied.                        - A medium amount of food (residue) in the stomach.                        - Normal mucosa was found in the entire stomach.                         Biopsied.                        - Esophagogastric landmarks identified.                        - Z-line regular.                        -  Normal mucosa was found in the entire esophagus. Recommendation:        - Discharge patient to home.                        - Resume previous diet.                        - Continue present medications.                        - Await pathology results.                        - Return to GI clinic as previously scheduled. Procedure Code(s):     --- Professional ---                        [phone], Esophagogastroduodenoscopy, flexible,                         transoral; with biopsy, single or multiple Diagnosis Code(s):     --- Professional ---                        R11.0, Nausea CPT copyright 2019 [employer]. All rights reserved. The codes documented in this report are preliminary and  upon coder review may  be revised to meet current compliance requirements. Attending Participation:      I personally performed the entire procedure. Volney American, DO [name] DO, DO 10/26/2021 9:31:21 AM This report has been signed electronically. Number of Addenda: 0 Note Initiated On: 10/26/2021 9:06 AM Estimated Blood Loss:  Estimated blood loss was minimal.      [facility]

## 2021-10-27 ENCOUNTER — Encounter: Payer: Self-pay | Admitting: Gastroenterology

## 2021-10-31 LAB — SURGICAL PATHOLOGY

## 2022-01-19 ENCOUNTER — Other Ambulatory Visit: Payer: Self-pay | Admitting: Internal Medicine

## 2022-01-19 ENCOUNTER — Ambulatory Visit
Admission: RE | Admit: 2022-01-19 | Discharge: 2022-01-19 | Disposition: A | Discharge status: Home / Self Care | Payer: BC Managed Care – PPO | Source: Ambulatory Visit | Attending: Internal Medicine | Admitting: Internal Medicine

## 2022-01-19 ENCOUNTER — Other Ambulatory Visit: Payer: Self-pay

## 2022-01-19 DIAGNOSIS — H5704 Mydriasis: Secondary | ICD-10-CM

## 2022-03-01 ENCOUNTER — Ambulatory Visit
Admission: RE | Admit: 2022-03-01 | Discharge: 2022-03-01 | Disposition: A | Discharge status: Home / Self Care | Payer: BC Managed Care – PPO | Source: Ambulatory Visit | Attending: Internal Medicine | Admitting: Internal Medicine

## 2022-03-01 ENCOUNTER — Other Ambulatory Visit: Payer: Self-pay | Admitting: Internal Medicine

## 2022-03-01 DIAGNOSIS — R109 Unspecified abdominal pain: Secondary | ICD-10-CM

## 2022-03-01 MED ORDER — IOPAMIDOL (ISOVUE-300) INJECTION 61%
100.0000 mL | Freq: Once | INTRAVENOUS | Status: AC | PRN
  Administered 2022-03-01: 100 mL via INTRAVENOUS

## 2022-10-24 ENCOUNTER — Ambulatory Visit
Admission: EM | Admit: 2022-10-24 | Discharge: 2022-10-24 | Disposition: A | Discharge status: Home / Self Care | Payer: BC Managed Care – PPO | Attending: Internal Medicine | Admitting: Internal Medicine

## 2022-10-24 DIAGNOSIS — J101 Influenza due to other identified influenza virus with other respiratory manifestations: Secondary | ICD-10-CM

## 2022-10-24 LAB — POCT INFLUENZA A/B
Influenza A, POC: POSITIVE — AB
Influenza B, POC: NEGATIVE

## 2022-10-24 MED ORDER — OSELTAMIVIR PHOSPHATE 75 MG PO CAPS: 75.0000 mg | ORAL_CAPSULE | Freq: Two times a day (BID) | ORAL | 0 refills | Status: AC

## 2022-10-24 NOTE — Discharge Instructions (Signed)
You have flu A.  I am treating this with Tamiflu.  Please follow-up if symptoms persist or worsen.  Ensure adequate fluid hydration and rest.

## 2022-10-24 NOTE — ED Provider Notes (Signed)
[facility]    CSN: [id number] Arrival date & time: 10/24/22  [id number]      History   Chief Complaint Chief Complaint  Patient presents with   Generalized Body Aches    HPI Jody Crane is a 36 y.o. female.   Patient presents with generalized body aches, nasal congestion, cough, chills, fever that started yesterday.  Tmax at home was 103.  Patient reports that they have been exposed to somebody with influenza.  Patient has been alternating Tylenol and ibuprofen for fever.  Denies chest pain, shortness of breath, sore throat, ear pain, nausea, vomiting, diarrhea, abdominal pain.     Past Medical History:  Diagnosis Date   Anxiety    Chronic cystitis    Depression    Elevated blood pressure reading without diagnosis of hypertension    GERD (gastroesophageal reflux disease)    Gestational diabetes    gestational   PCOS (polycystic ovarian syndrome)     Patient Active Problem List   Diagnosis Date Noted   S/P cesarean section: indication: fetal tachycardia and arrest of dilation  07/28/2018   Gestational hypertension 07/28/2018   Postpartum care following cesarean delivery (9/23) 07/28/2018   GDM, class A2 07/28/2018   Encounter for planned induction of labor 07/27/2018    Past Surgical History:  Procedure Laterality Date   BREAST ENHANCEMENT SURGERY     CESAREAN SECTION N/A 07/28/2018   Procedure: CESAREAN SECTION;  Surgeon: Noland FordyceFogleman, Kelly, MD;  [facility];  Service: Obstetrics;  Laterality: N/A;   CHOLECYSTECTOMY     ESOPHAGOGASTRODUODENOSCOPY (EGD) WITH PROPOFOL N/A 10/26/2021   Procedure: ESOPHAGOGASTRODUODENOSCOPY (EGD) WITH PROPOFOL;  Surgeon: Jaynie Collinsusso, Steven Michael, DO;  [facility];  Service: Gastroenterology;  Laterality: N/A;  DM    OB History     Gravida  1   Para  1   Term  1   Preterm      AB      Living  1      SAB      IAB      Ectopic      Multiple  0   Live Births  1             Home Medications    Prior to Admission medications   Medication Sig Start Date End Date Taking? Authorizing Provider  oseltamivir (TAMIFLU) 75 MG capsule Take 1 capsule (75 mg total) by mouth every 12 (twelve) hours. 10/24/22  Yes Shatera Rennert, Rolly SalterHaley E, FNP  albuterol (VENTOLIN HFA) 108 (90 Base) MCG/ACT inhaler Inhale 2 puffs into the lungs as needed. wheezing 06/14/15   [provider]  amoxicillin-clavulanate (AUGMENTIN) 875-125 MG tablet Take 1 tablet by mouth every 12 (twelve) hours. Patient not taking: Reported on 10/25/2021 04/10/21   Graciella FreerLayden, Lindsey A, PA-C  amphetamine-dextroamphetamine (ADDERALL) 10 MG tablet Take 10 mg by mouth daily as needed.    [provider]  buPROPion (WELLBUTRIN XL) 300 MG 24 hr tablet Take 300 mg by mouth daily.    [provider]  clindamycin (CLINDAGEL) 1 % gel Apply 1 application topically 2 (two) times daily.    [provider]  clonazePAM (KLONOPIN) 1 MG tablet Take 1 mg by mouth at bedtime.    [provider]  escitalopram (LEXAPRO) 20 MG tablet Take 20 mg by mouth daily.     [provider]  Fe Cbn-Fe Gluc-FA-B12-C-DSS (FERRALET 90) 90-1 MG TABS Take 1 tablet by mouth daily. Patient not taking: Reported on 10/25/2021  06/26/18   [provider]  fluticasone (FLONASE) 50 MCG/ACT nasal spray Place 2 sprays into both nostrils daily for 5 days. 04/10/21 04/15/21  Maxwell Caul, PA-C  ibuprofen (ADVIL,MOTRIN) 600 MG tablet Take 1 tablet (600 mg total) by mouth every 6 (six) hours. 07/30/18   Lawhorn, Vanessa [place], CNM  Levonorgestrel (LILETTA, 52 MG, IU) by Intrauterine route.    [provider]  lisdexamfetamine (VYVANSE) 70 MG capsule Take 70 mg by mouth daily.    [provider]  magnesium oxide (MAG-OX) 400 (241.3 Mg) MG tablet Take 1 tablet (400 mg total) by mouth daily. Patient not taking: Reported on 10/25/2021 07/31/18   Gunnar Bulla, CNM  meloxicam (MOBIC) 15  MG tablet Take 15 mg by mouth daily as needed for pain. Patient not taking: Reported on 10/26/2021    [provider]  metFORMIN (GLUCOPHAGE) 1000 MG tablet Take 1,000 mg by mouth 2 (two) times daily with a meal. Patient not taking: Reported on 10/26/2021    [provider]  nystatin (MYCOSTATIN/NYSTOP) powder Apply 1 application topically 2 (two) times daily.    [provider]  omeprazole (PRILOSEC) 40 MG capsule Take 40 mg by mouth 2 (two) times daily.    [provider]  promethazine-dextromethorphan (PROMETHAZINE-DM) 6.25-15 MG/5ML syrup Take 5 mLs by mouth 4 (four) times daily as needed for cough.    [provider]  propranolol (INDERAL) 40 MG tablet Take 40 mg by mouth 2 (two) times daily. Patient not taking: Reported on 10/26/2021    [provider]  rosuvastatin (CRESTOR) 20 MG tablet Take 20 mg by mouth daily.    [provider]  Semaglutide, 1 MG/DOSE, (OZEMPIC, 1 MG/DOSE,) 2 MG/1.5ML SOPN Inject 0.25 mg into the skin once a week.    [provider]  simethicone (MYLICON) 80 MG chewable tablet Chew 1 tablet (80 mg total) by mouth 3 (three) times daily after meals. 07/30/18   Lawhorn, Vanessa [place], CNM    Family History Family History  Problem Relation Age of Onset   Celiac disease Mother    Diabetes Father    Hyperlipidemia Father    Glaucoma Maternal Grandmother    Hypertension Maternal Grandmother    Kidney disease Maternal Grandmother    Thyroid disease Maternal Grandmother    Aneurysm Maternal Grandmother    Aneurysm Maternal Grandfather    Cataracts Maternal Grandfather    Glaucoma Maternal Grandfather    Hypertension Maternal Grandfather    Diabetes Paternal Grandfather     Social History Social History   Tobacco Use   Smoking status: Former    Packs/day: 1.00    Types: Cigarettes    Quit date: 07/24/2015    Years since quitting: 7.2   Smokeless tobacco: Never  Vaping Use   Vaping  Use: Never used  Substance Use Topics   Alcohol use: No   Drug use: No     Allergies   Macrobid [nitrofurantoin macrocrystal]   Review of Systems Review of Systems Per HPI  Physical Exam Triage Vital Signs ED Triage Vitals  Enc Vitals Group     BP 10/24/22 0947 102/70     Pulse Rate 10/24/22 0947 (!) 110     Resp 10/24/22 0947 16     Temp 10/24/22 0947 99.8 F (37.7 C)     Temp Source 10/24/22 0947 Oral     SpO2 10/24/22 0947 98 %     Weight --      Height --  Head Circumference --      Peak Flow --      Pain Score 10/24/22 0948 0     Pain Loc --      Pain Edu? --      Excl. in GC? --    No data found.  Updated Vital Signs BP 102/70 (BP Location: Left Arm)   Pulse (!) 110   Temp 99.8 F (37.7 C) (Oral)   Resp 16   SpO2 98%   Breastfeeding No   Visual Acuity Right Eye Distance:   Left Eye Distance:   Bilateral Distance:    Right Eye Near:   Left Eye Near:    Bilateral Near:     Physical Exam Constitutional:      General: She is not in acute distress.    Appearance: Normal appearance. She is not toxic-appearing or diaphoretic.  HENT:     Head: Normocephalic and atraumatic.     Right Ear: Tympanic membrane and ear canal normal.     Left Ear: Tympanic membrane and ear canal normal.     Nose: Congestion present.     Mouth/Throat:     Mouth: Mucous membranes are moist.     Pharynx: Posterior oropharyngeal erythema present.  Eyes:     Extraocular Movements: Extraocular movements intact.     Conjunctiva/sclera: Conjunctivae normal.     Pupils: Pupils are equal, round, and reactive to light.  Cardiovascular:     Rate and Rhythm: Normal rate and regular rhythm.     Pulses: Normal pulses.     Heart sounds: Normal heart sounds.  Pulmonary:     Effort: Pulmonary effort is normal. No respiratory distress.     Breath sounds: Normal breath sounds. No stridor. No wheezing, rhonchi or rales.  Abdominal:     General: Abdomen is flat. Bowel sounds are  normal.     Palpations: Abdomen is soft.  Musculoskeletal:        General: Normal range of motion.     Cervical back: Normal range of motion.  Skin:    General: Skin is warm and dry.  Neurological:     General: No focal deficit present.     Mental Status: She is alert and oriented to person, place, and time. Mental status is at baseline.  Psychiatric:        Mood and Affect: Mood normal.        Behavior: Behavior normal.      UC Treatments / Results  Labs (all labs ordered are listed, but only abnormal results are displayed) Labs Reviewed  POCT INFLUENZA A/B - Abnormal; Notable for the following components:      Result Value   Influenza A, POC Positive (*)    All other components within normal limits    EKG   Radiology No results found.  Procedures Procedures (including critical care time)  Medications Ordered in UC Medications - No data to display  Initial Impression / Assessment and Plan / UC Course  I have reviewed the triage vital signs and the nursing notes.  Pertinent labs & imaging results that were available during my care of the patient were reviewed by me and considered in my medical decision making (see chart for details).     Patient rested positive for influenza A.  Will treat with Tamiflu given patient is inside the treatment window.  Discussed supportive care and symptom management with patient.  Fever monitoring and management discussed with patient as well.  Patient has  low-grade temp in urgent care but last dose of Tylenol was a few hours prior to being seen in urgent care.  Suspect tachycardia on initial triage is due to low-grade temp.  On physical exam, there is no tachycardia.  Therefore, do not think any further workup is necessary for this given that is most likely related to acute illness and fever.  Patient was encouraged to increase fluid intake and rest.  Discussed strict return and ER precautions.  Patient verbalized understanding and was  agreeable with plan. Final Clinical Impressions(s) / UC Diagnoses   Final diagnoses:  Influenza A     Discharge Instructions      You have flu A.  I am treating this with Tamiflu.  Please follow-up if symptoms persist or worsen.  Ensure adequate fluid hydration and rest.    ED Prescriptions     Medication Sig Dispense Auth. Provider   oseltamivir (TAMIFLU) 75 MG capsule Take 1 capsule (75 mg total) by mouth every 12 (twelve) hours. 10 capsule Gustavus Bryant, [place]      PDMP not reviewed this encounter.   Gustavus Bryant, [place] 10/24/22 1148

## 2022-10-24 NOTE — ED Triage Notes (Signed)
Pt c/o fever, body aches, onset ~ yesterday. Exposed to flu approx Monday.

## 2022-11-27 IMAGING — CT CT HEAD W/O CM
2 series · 15 of 30 positions shown, 17 images · non-contrast
Comparison: April 10, 2021 CT

CLINICAL DATA: Woke with dilated pupil not responding to light.



[Series 2: head without · axial · non-contrast · 0.42mm/px · z∈[-132,-17]mm · 7 of 31 slices shown, 9 images]
[im 4/31  brain]
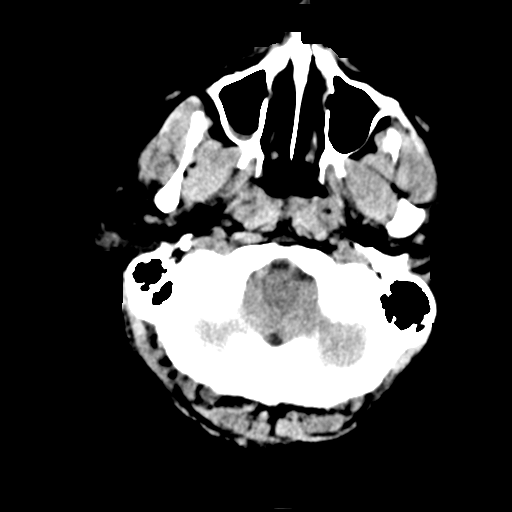
[im 4/31  bone]
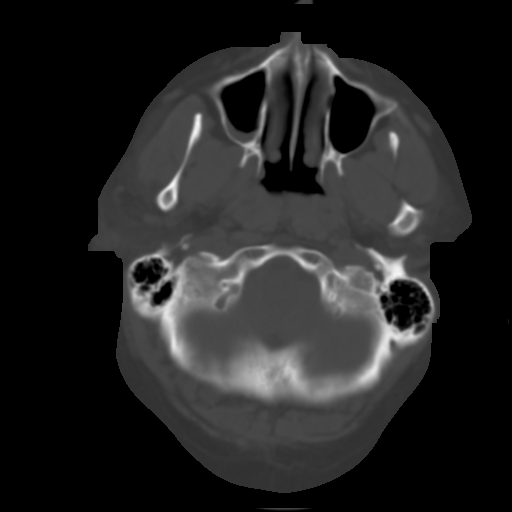
[im 8/31  brain]
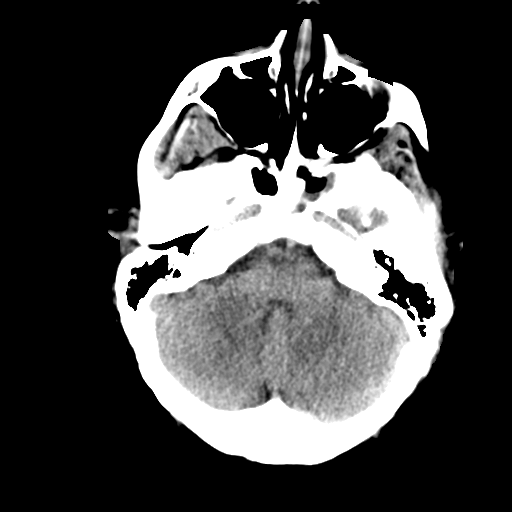
[im 12/31  brain]
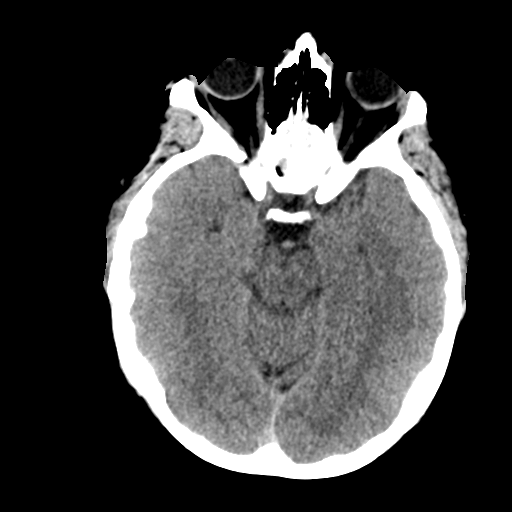
[im 16/31  brain]
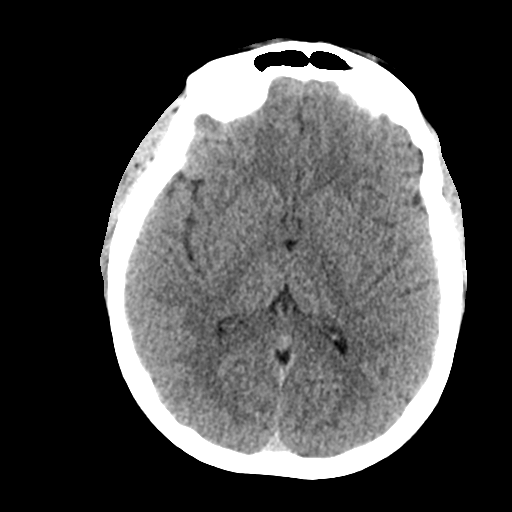
[im 19/31  brain]
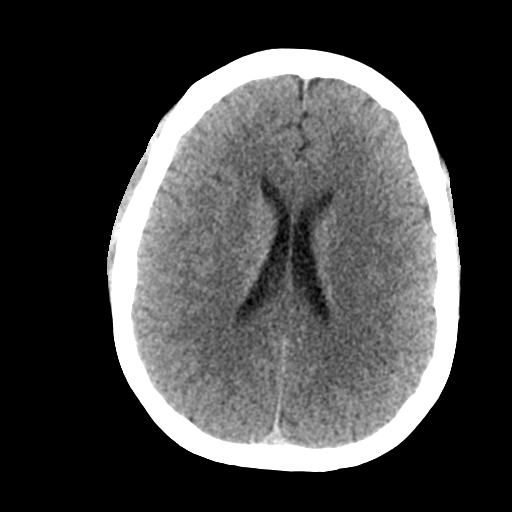
[im 19/31  bone]
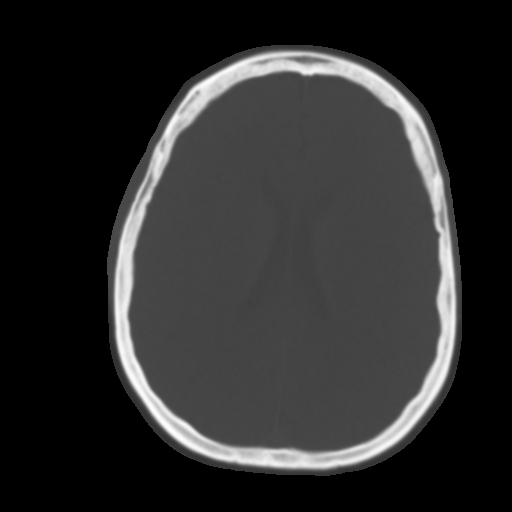
[im 23/31  brain]
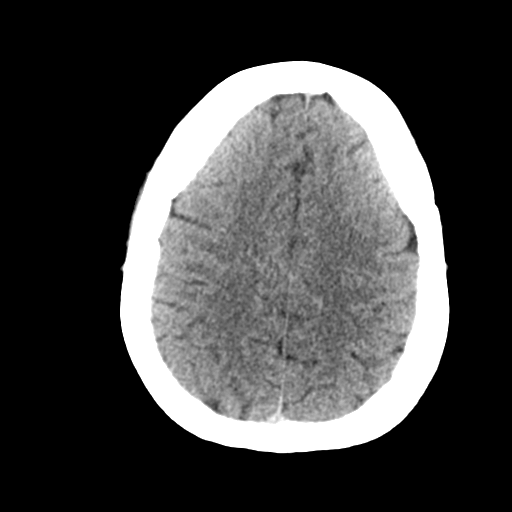
[im 27/31  brain]
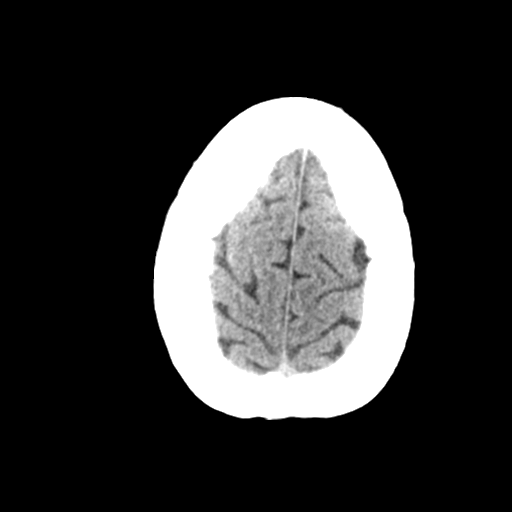

[Series 3: head bone · axial · 0.42mm/px · z∈[-133,-11]mm · 8 of 77 slices shown]
[im 8/77  bone]
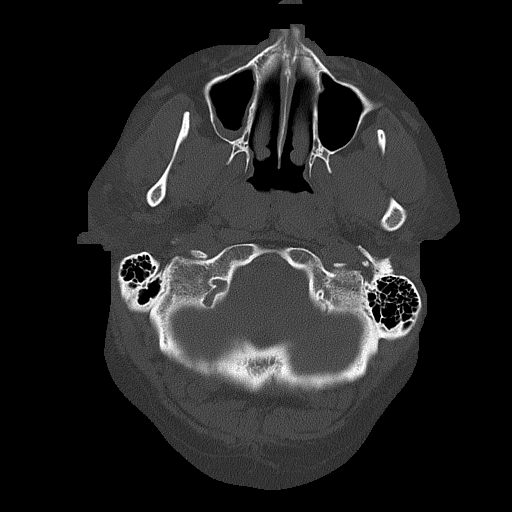
[im 16/77  bone]
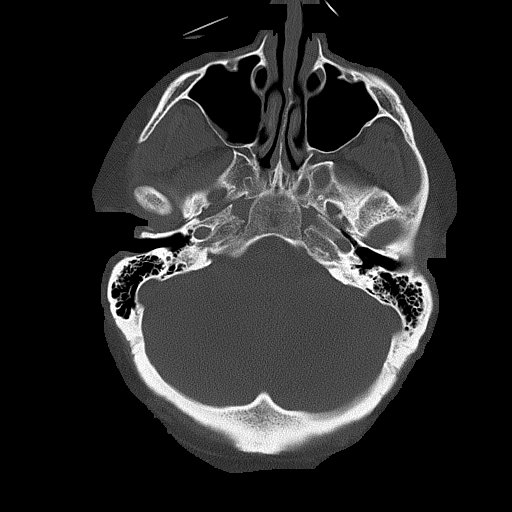
[im 23/77  bone]
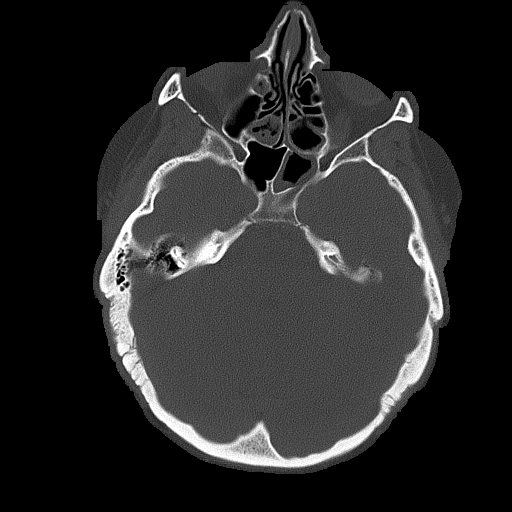
[im 35/77  bone]
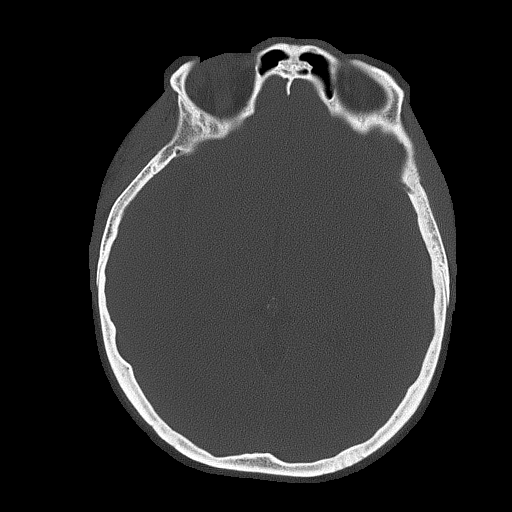
[im 42/77  bone]
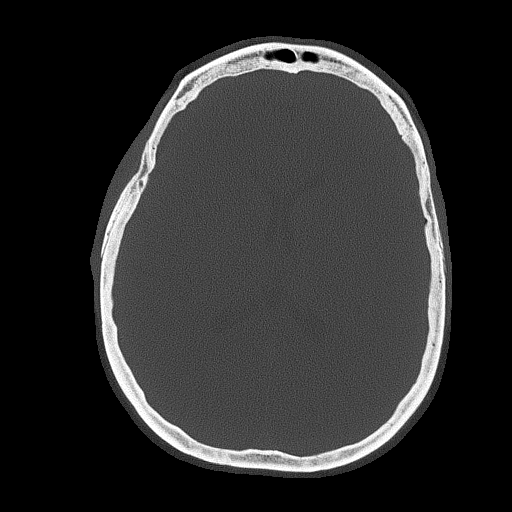
[im 54/77  bone]
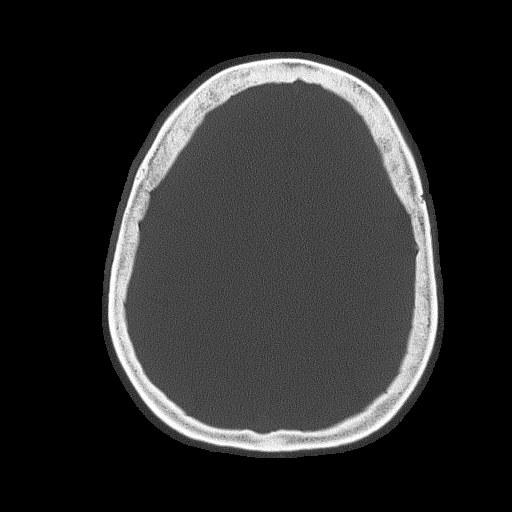
[im 61/77  bone]
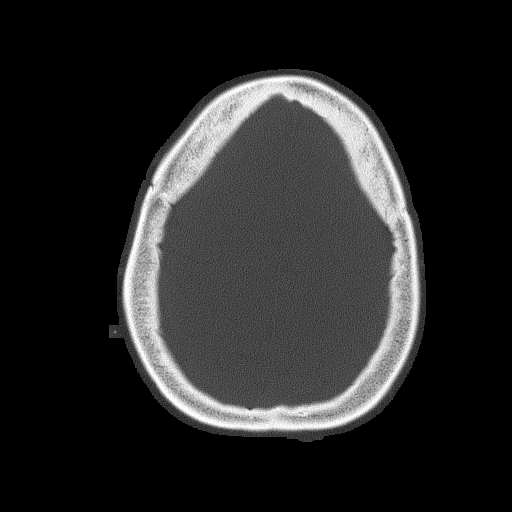
[im 69/77  bone]
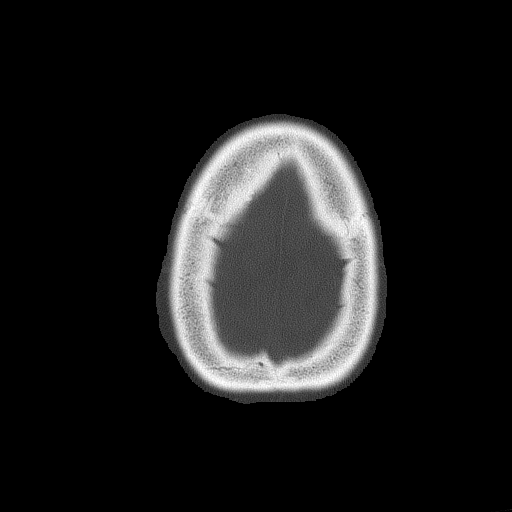

[15 of 30 positions shown; findings below may reference images not displayed]

FINDINGS: Brain: No evidence of acute infarction, hemorrhage, hydrocephalus,
extra-axial collection or mass lesion/mass effect.

Vascular: No hyperdense vessel or unexpected calcification.

Skull: Normal. Negative for fracture or focal lesion.

Sinuses/Orbits: Mucosal thickening of the left sphenoid sinus,
posterior ethmoid air cells and right maxillary sinus. Orbits are
unremarkable.

Other: Mastoid air cells are predominantly clear.
IMPRESSION: 1. No acute intracranial pathology.
2. Mucosal thickening of the left sphenoid sinus, posterior ethmoid
air cells and right maxillary sinus.

## 2023-01-07 IMAGING — CT CT ABD-PELV W/ CM
1 of 3 series · 14 of 32 positions shown, 19 images · IV contrast (agent unspecified)
Comparison: CT abdomen and pelvis 03/11/2016

CLINICAL DATA: Bilateral flank pain

EXAM:
CT ABDOMEN AND PELVIS WITH CONTRAST
TECHNIQUE: Multidetector CT imaging of the abdomen and pelvis was performed
using the standard protocol following bolus administration of
intravenous contrast.

[Series 2: a/p w/ 5mm · axial · 0.98mm/px · z∈[-511,-46]mm · 14 of 105 slices shown, 19 images]
[im 6/105  soft-tissue]
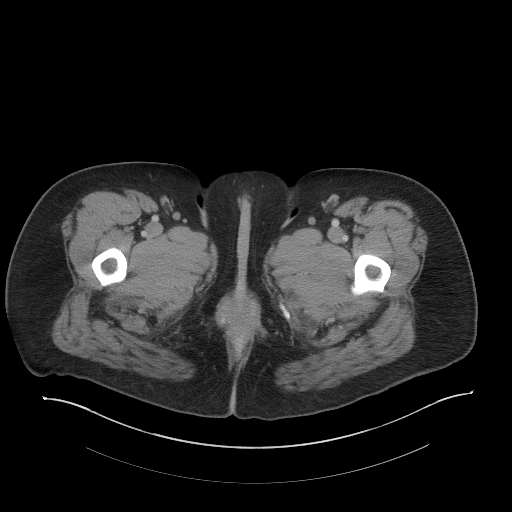
[im 6/105  bone]
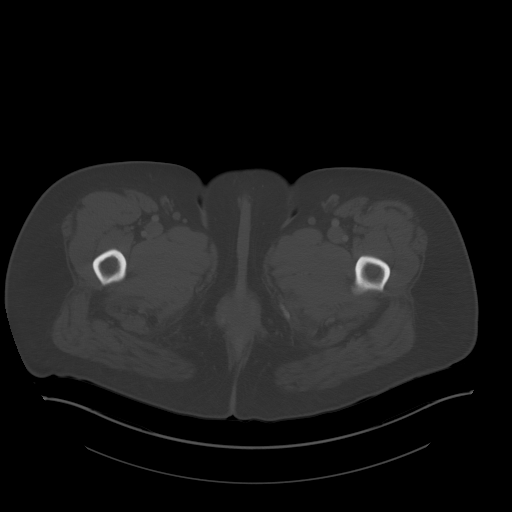
[im 12/105  soft-tissue]
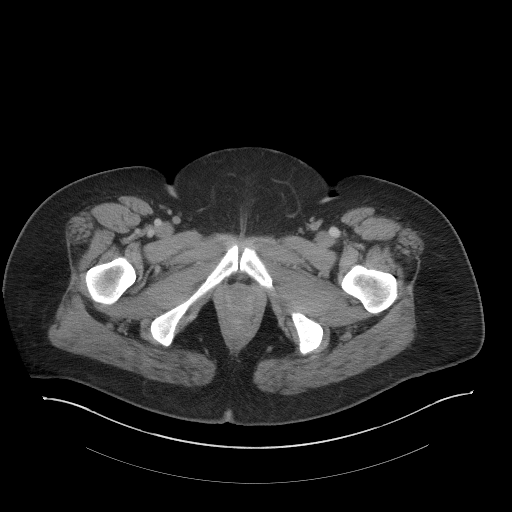
[im 24/105  soft-tissue]
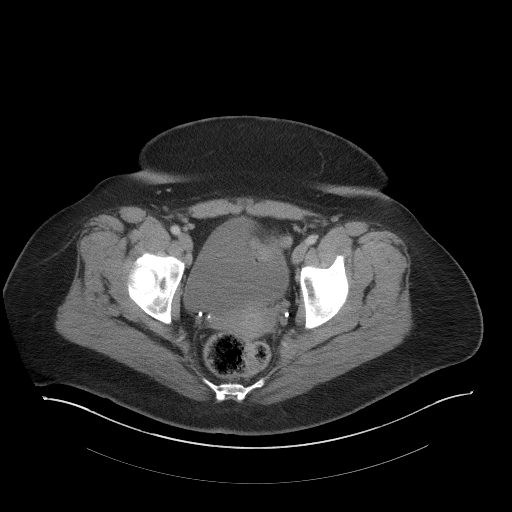
[im 29/105  soft-tissue]
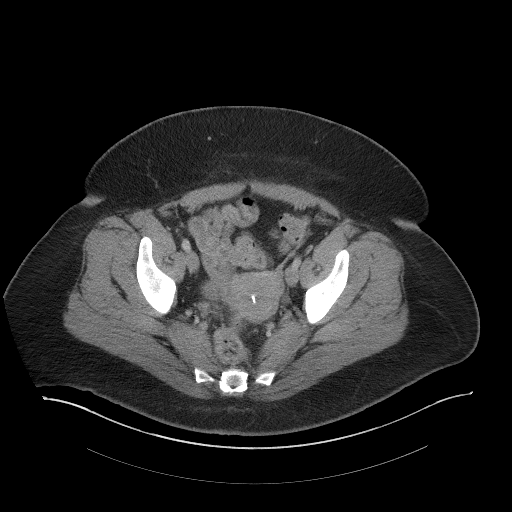
[im 35/105  soft-tissue]
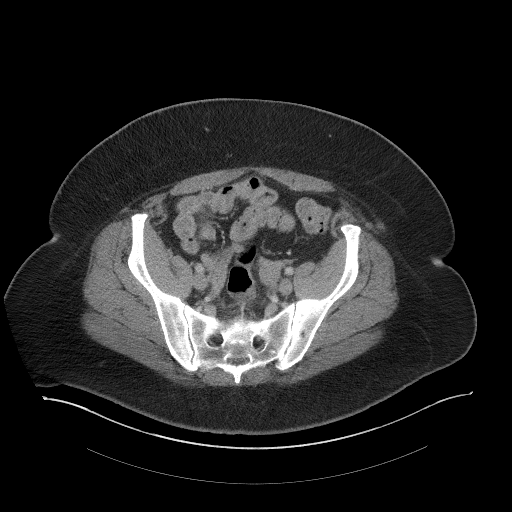
[im 47/105  soft-tissue]
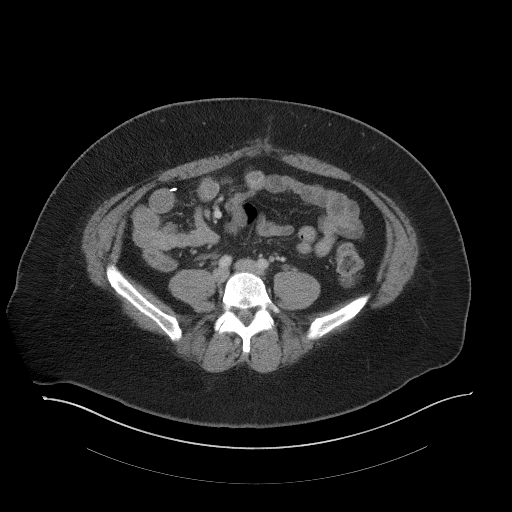
[im 53/105  soft-tissue]
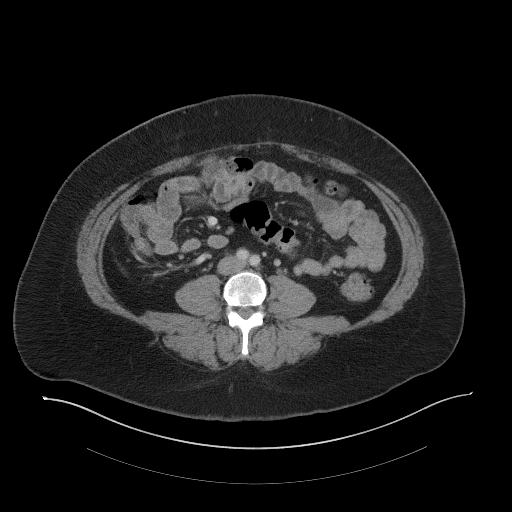
[im 58/105  soft-tissue]
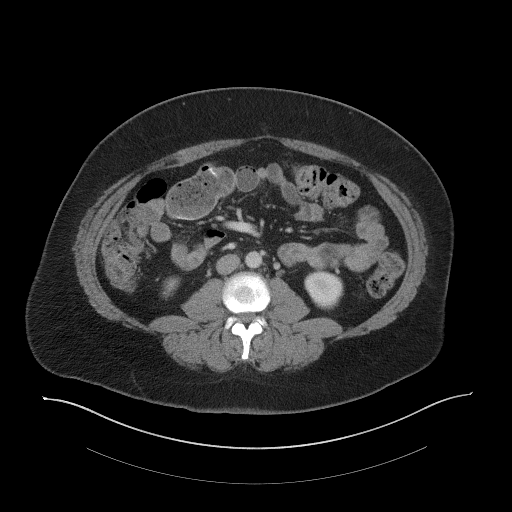
[im 70/105  soft-tissue]
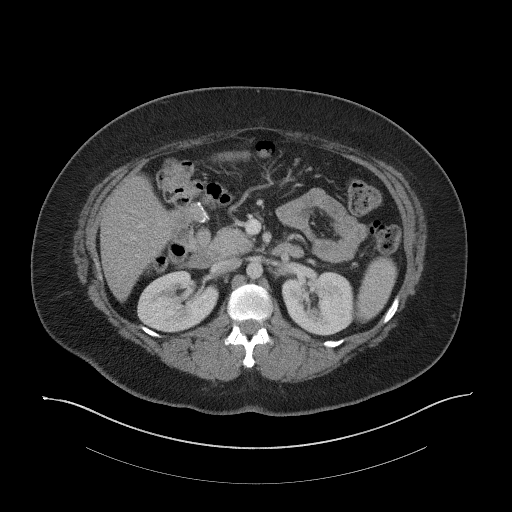
[im 70/105  bone]
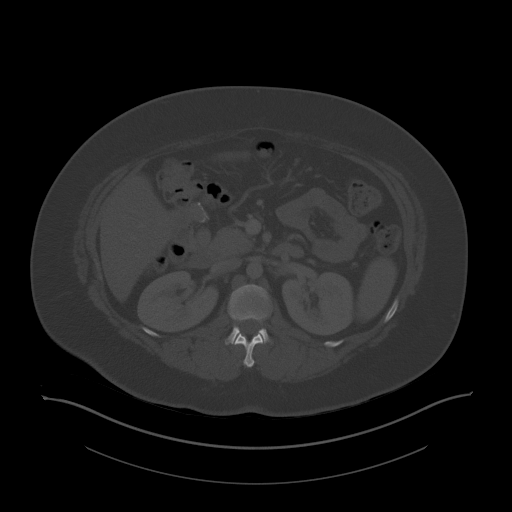
[im 76/105  soft-tissue]
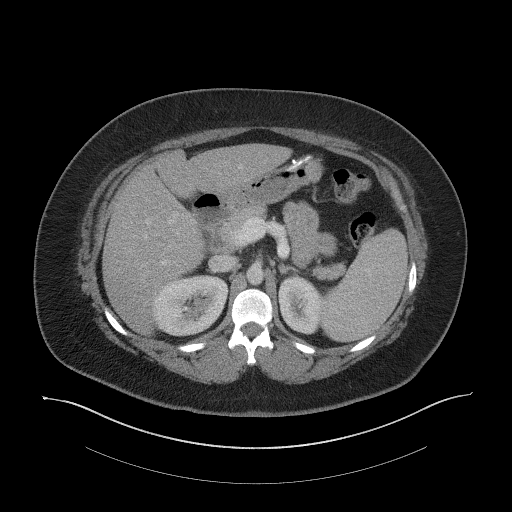
[im 81/105  soft-tissue]
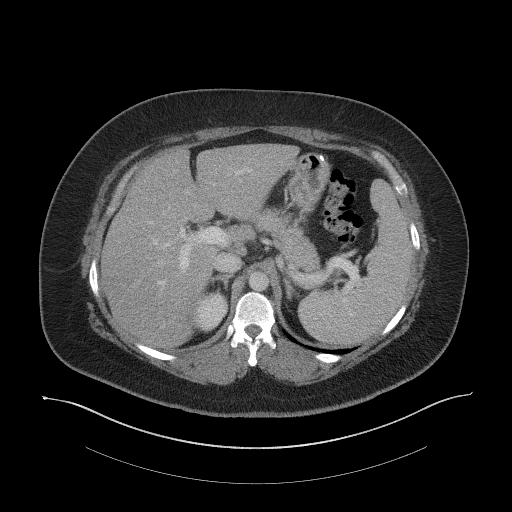
[im 81/105  lung]
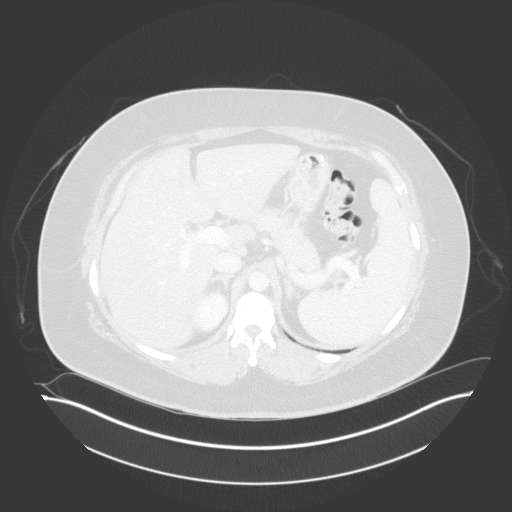
[im 87/105  lung]
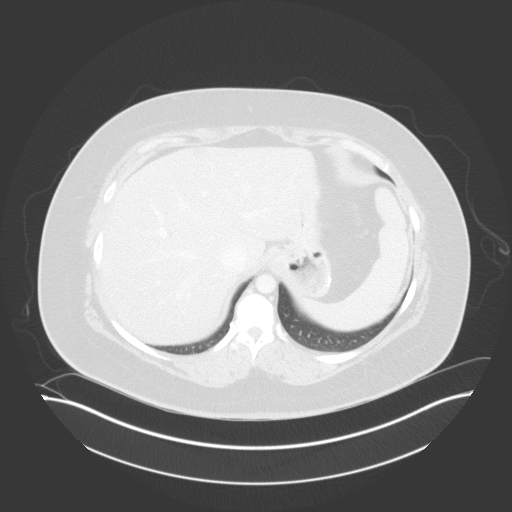
[im 93/105  soft-tissue]
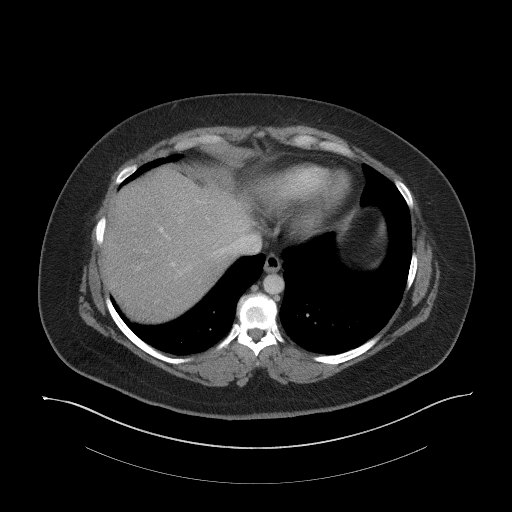
[im 93/105  lung]
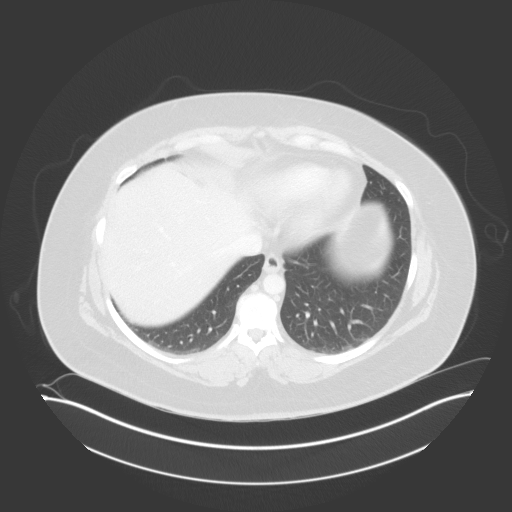
[im 99/105  soft-tissue]
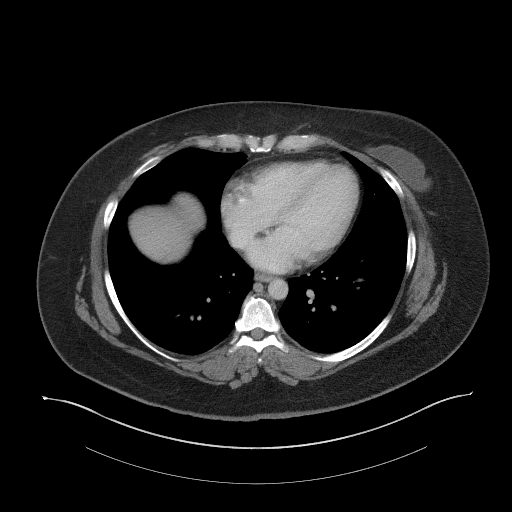
[im 99/105  lung]
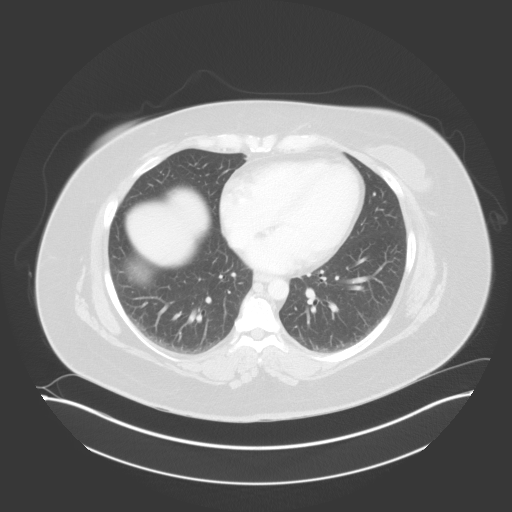

[14 of 32 positions shown; findings below may reference images not displayed]

RADIATION DOSE REDUCTION: This exam was performed according to the
departmental dose-optimization program which includes automated
exposure control, adjustment of the mA and/or kV according to
patient size and/or use of iterative reconstruction technique.

CONTRAST:  100mL [id number] IOPAMIDOL ([id number]) INJECTION 61%
FINDINGS: Lower chest: No acute abnormality.

Hepatobiliary: Liver is enlarged measuring 19.5 cm in length. No
suspicious hepatic mass identified. Gallbladder is surgically
absent. No biliary ductal dilatation.

Pancreas: Unremarkable. No pancreatic ductal dilatation or
surrounding inflammatory changes.

Spleen: Enlarged measuring 16.9 cm in length.

Adrenals/Urinary Tract: Adrenal glands are unremarkable. Kidneys are
normal, without renal calculi, focal lesion, or hydronephrosis.
Bladder is unremarkable.

Stomach/Bowel: Previous sleeve gastrectomy and duodenal switch
surgical changes. No bowel obstruction, free air or pneumatosis. No
bowel wall edema identified. Mild-to-moderate retained fecal
material in the colon. Appendix is normal.

Vascular/Lymphatic: No significant vascular findings are present. No
enlarged abdominal or pelvic lymph nodes.

Reproductive: IUD in the uterus. No suspicious adnexal mass
identified.

Other: No ascites.

Musculoskeletal: Degenerative changes in the lumbar spine. No
suspicious bony lesions identified.
IMPRESSION: 1. No acute process identified.
2. Hepatosplenomegaly.

## 2024-05-11 ENCOUNTER — Ambulatory Visit
Admission: EM | Admit: 2024-05-11 | Discharge: 2024-05-11 | Disposition: A | Discharge status: Home / Self Care | Attending: Family Medicine | Admitting: Family Medicine

## 2024-05-11 DIAGNOSIS — Z87891 Personal history of nicotine dependence: Secondary | ICD-10-CM | POA: Diagnosis not present

## 2024-05-11 DIAGNOSIS — J40 Bronchitis, not specified as acute or chronic: Secondary | ICD-10-CM | POA: Diagnosis not present

## 2024-05-11 MED ORDER — PREDNISONE 10 MG (21) PO TBPK: ORAL_TABLET | Freq: Every day | ORAL | 0 refills | Status: AC

## 2024-05-11 MED ORDER — AZITHROMYCIN 250 MG PO TABS: ORAL_TABLET | ORAL | 0 refills | Status: AC

## 2024-05-11 MED ORDER — PROMETHAZINE-DM 6.25-15 MG/5ML PO SYRP: 5.0000 mL | ORAL_SOLUTION | Freq: Four times a day (QID) | ORAL | 0 refills | Status: AC | PRN

## 2024-05-11 NOTE — Discharge Instructions (Addendum)
 Stop by the pharmacy to pick up your prescriptions.  Follow up with your primary care provider or return to the urgent care, if not improving.

## 2024-05-11 NOTE — ED Triage Notes (Signed)
 Sx x 2 weeks  Sinus pressure Productive cough green mucus Headache Middle back pain

## 2024-05-11 NOTE — ED Provider Notes (Signed)
 [facility]    CSN: [id number] Arrival date & time: 05/11/24  1038      History   Chief Complaint Chief Complaint  Patient presents with   Cough    HPI Cloris Flippo is a 38 y.o. female.   HPI  History obtained from the patient. Analiah presents for sinus pressure, headache, hoarseness and productive cough for the past 2 weeks.  Started having fatigue and chest discomfort. She tried Flonase  and Zyrtec D without much relief. Denies fever, vomiting or diarrhea.  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 She took a COVID and influenza test and it was negative. Says her job sent her to be evaluated.    Denies asthma. Former social smoking when she was younger.      Past Medical History:  Diagnosis Date   Anxiety    Chronic cystitis    Depression    Elevated blood pressure reading without diagnosis of hypertension    GERD (gastroesophageal reflux disease)    Gestational diabetes    gestational   PCOS (polycystic ovarian syndrome)     Patient Active Problem List   Diagnosis Date Noted   S/P cesarean section: indication: fetal tachycardia and arrest of dilation  07/28/2018   Gestational hypertension 07/28/2018   Postpartum care following cesarean delivery (9/23) 07/28/2018   GDM, class A2 07/28/2018   Encounter for planned induction of labor 07/27/2018    Past Surgical History:  Procedure Laterality Date   BREAST ENHANCEMENT SURGERY     CESAREAN SECTION N/A 07/28/2018   Procedure: CESAREAN SECTION;  Surgeon: Kandyce Sor, MD;  Location: [facility];  Service: Obstetrics;  Laterality: N/A;   CHOLECYSTECTOMY     ESOPHAGOGASTRODUODENOSCOPY (EGD) WITH PROPOFOL  N/A 10/26/2021   Procedure: ESOPHAGOGASTRODUODENOSCOPY (EGD) WITH PROPOFOL ;  Surgeon: Onita Elspeth Sharper, DO;  Location: [facility] ENDOSCOPY;  Service: Gastroenterology;  Laterality: N/A;  DM    OB History     Gravida  1   Para  1   Term  1   Preterm      AB      Living  1       SAB      IAB      Ectopic      Multiple  0   Live Births  1            Home Medications    Prior to Admission medications   Medication Sig Start Date End Date Taking? Authorizing Provider  azithromycin  (ZITHROMAX  Z-PAK) 250 MG tablet Take 2 tablets on day 1 then 1 tablet daily 05/11/24  Yes Cheyene Hamric, DO  buPROPion (WELLBUTRIN XL) 300 MG 24 hr tablet Take 300 mg by mouth daily.   Yes [provider]  busPIRone (BUSPAR) 10 MG tablet Take 10 mg by mouth 2 (two) times daily.   Yes [provider]  escitalopram  (LEXAPRO ) 20 MG tablet Take 20 mg by mouth daily.    Yes [provider]  LINZESS 290 MCG CAPS capsule Take 290 mcg by mouth. 02/03/24  Yes [provider]  omeprazole (PRILOSEC) 40 MG capsule Take 40 mg by mouth 2 (two) times daily.   Yes [provider]  predniSONE  (STERAPRED UNI-PAK 21 TAB) 10 MG (21) TBPK tablet Take by mouth daily. Take 6 tabs by mouth daily for 1, then 5 tabs for 1 day, then 4 tabs for 1 day, then 3 tabs for 1 day, then 2 tabs for 1 day, then 1 tab  for 1 day. 05/11/24  Yes Reda Gettis, DO  promethazine -dextromethorphan (PROMETHAZINE -DM) 6.25-15 MG/5ML syrup Take 5 mLs by mouth 4 (four) times daily as needed. 05/11/24  Yes Staley Lunz, DO  amphetamine-dextroamphetamine (ADDERALL) 10 MG tablet Take 10 mg by mouth daily as needed.    [provider]  clindamycin (CLINDAGEL) 1 % gel Apply 1 application topically 2 (two) times daily.    [provider]  clonazePAM (KLONOPIN) 1 MG tablet Take 1 mg by mouth at bedtime.    [provider]  Fe Cbn-Fe Gluc-FA-B12-C-DSS (FERRALET 90) 90-1 MG TABS Take 1 tablet by mouth daily. Patient not taking: Reported on 10/25/2021 06/26/18   [provider]  fluticasone  (FLONASE ) 50 MCG/ACT nasal spray Place 2 sprays into both nostrils daily for 5 days. 04/10/21 04/15/21  Layden, Lindsey A, PA-C  ibuprofen  (ADVIL ,MOTRIN ) 600 MG tablet Take 1  tablet (600 mg total) by mouth every 6 (six) hours. 07/30/18   Lawhorn, Mavis Browning, CNM  Levonorgestrel (LILETTA, 52 MG, IU) by Intrauterine route.    [provider]  lisdexamfetamine (VYVANSE) 70 MG capsule Take 70 mg by mouth daily.    [provider]  magnesium  oxide (MAG-OX) 400 (241.3 Mg) MG tablet Take 1 tablet (400 mg total) by mouth daily. Patient not taking: Reported on 10/25/2021 07/31/18   Laurier Mavis Browning, CNM  meloxicam (MOBIC) 15 MG tablet Take 15 mg by mouth daily as needed for pain. Patient not taking: Reported on 10/26/2021    [provider]  metFORMIN  (GLUCOPHAGE ) 1000 MG tablet Take 1,000 mg by mouth 2 (two) times daily with a meal. Patient not taking: Reported on 10/26/2021    [provider]  nystatin (MYCOSTATIN/NYSTOP) powder Apply 1 application topically 2 (two) times daily.    [provider]  oseltamivir  (TAMIFLU ) 75 MG capsule Take 1 capsule (75 mg total) by mouth every 12 (twelve) hours. 10/24/22   Hazen Darryle BRAVO, FNP  propranolol (INDERAL) 40 MG tablet Take 40 mg by mouth 2 (two) times daily. Patient not taking: Reported on 10/26/2021    [provider]  rosuvastatin (CRESTOR) 20 MG tablet Take 20 mg by mouth daily.    [provider]  Semaglutide, 1 MG/DOSE, (OZEMPIC, 1 MG/DOSE,) 2 MG/1.5ML SOPN Inject 0.25 mg into the skin once a week.    [provider]  simethicone  (MYLICON) 80 MG chewable tablet Chew 1 tablet (80 mg total) by mouth 3 (three) times daily after meals. 07/30/18   Lawhorn, Mavis Browning, CNM    Family History Family History  Problem Relation Age of Onset   Celiac disease Mother    Diabetes Father    Hyperlipidemia Father    Glaucoma Maternal Grandmother    Hypertension Maternal Grandmother    Kidney disease Maternal Grandmother    Thyroid disease Maternal Grandmother    Aneurysm Maternal Grandmother    Aneurysm Maternal Grandfather    Cataracts Maternal  Grandfather    Glaucoma Maternal Grandfather    Hypertension Maternal Grandfather    Diabetes Paternal Grandfather     Social History Social History   Tobacco Use   Smoking status: Former    Current packs/day: 0.00    Types: Cigarettes    Quit date: 07/24/2015    Years since quitting: 8.8   Smokeless tobacco: Never  Vaping Use   Vaping status: Never Used  Substance Use Topics   Alcohol use: No   Drug use: No     Allergies   Macrobid [nitrofurantoin  macrocrystal]   Review of Systems Review of Systems: negative unless otherwise stated in HPI.      Physical Exam Triage Vital Signs ED Triage Vitals  Encounter Vitals Group     BP 05/11/24 1052 114/75     Girls Systolic BP Percentile --      Girls Diastolic BP Percentile --      Boys Systolic BP Percentile --      Boys Diastolic BP Percentile --      Pulse Rate 05/11/24 1052 (!) 112     Resp 05/11/24 1052 19     Temp 05/11/24 1052 98.8 F (37.1 C)     Temp Source 05/11/24 1052 Oral     SpO2 05/11/24 1052 100 %     Weight --      Height --      Head Circumference --      Peak Flow --      Pain Score 05/11/24 1051 0     Pain Loc --      Pain Education --      Exclude from Growth Chart --    No data found.  Updated Vital Signs BP 114/75 (BP Location: Left Arm)   Pulse (!) 112   Temp 98.8 F (37.1 C) (Oral)   Resp 19   SpO2 100%   Visual Acuity Right Eye Distance:   Left Eye Distance:   Bilateral Distance:    Right Eye Near:   Left Eye Near:    Bilateral Near:     Physical Exam GEN:     alert, non-toxic appearing female in no distress    HENT:  mucus membranes moist, + nasal discharge EYES:   pupils equal and reactive, no scleral injection or discharge NECK:  normal ROM RESP:  no increased work of breathing,rhonchi bilaterally, no wheezing or rales  CVS:   regular rhythm, tachycardic  Skin:   warm and dry    UC Treatments / Results  Labs (all labs ordered are listed, but only abnormal  results are displayed) Labs Reviewed - No data to display  EKG   Radiology No results found.  Procedures Procedures (including critical care time)  Medications Ordered in UC Medications - No data to display  Initial Impression / Assessment and Plan / UC Course  I have reviewed the triage vital signs and the nursing notes.  Pertinent labs & imaging results that were available during my care of the patient were reviewed by me and considered in my medical decision making (see chart for details).       Pt is a 38 y.o. female who presents for 2 weeks of cough that is not improving.  Leonora is afebrile here without recent antipyretics. Satting well on room air. Overall pt is  non-toxic appearing, well hydrated, without respiratory distress. Pulmonary exam is remarkable for rhonchi and frequent cough.  After shared decision making, we will not pursue chest x-ray at this time.  COVID  and influenza testing deferred due to length of symptoms. Home COVID and influenza testing was negative.   Treat acute bronchitis with steroids and antibiotics as below.  Promethazine  DM cough syrup given for cough and allow patient to rest.  Pt is a former smoker. Typical duration of symptoms discussed. Return and ED precautions given and patient voiced understanding.   Discussed MDM, treatment plan and plan for follow-up with patient who agrees with plan.      Final Clinical Impressions(s) / UC Diagnoses   Final  diagnoses:  Bronchitis  Former light tobacco smoker     Discharge Instructions      Stop by the pharmacy to pick up your prescriptions.  Follow up with your primary care provider or return to the urgent care, if not improving.       ED Prescriptions     Medication Sig Dispense Auth. Provider   azithromycin  (ZITHROMAX  Z-PAK) 250 MG tablet Take 2 tablets on day 1 then 1 tablet daily 6 tablet Alam Guterrez, DO   predniSONE  (STERAPRED UNI-PAK 21 TAB) 10 MG (21) TBPK tablet Take by  mouth daily. Take 6 tabs by mouth daily for 1, then 5 tabs for 1 day, then 4 tabs for 1 day, then 3 tabs for 1 day, then 2 tabs for 1 day, then 1 tab for 1 day. 21 tablet Miguelangel Korn, DO   promethazine -dextromethorphan (PROMETHAZINE -DM) 6.25-15 MG/5ML syrup Take 5 mLs by mouth 4 (four) times daily as needed. 118 mL Shyloh Derosa, DO      PDMP not reviewed this encounter.   Darvis Croft, DO 05/11/24 1129
